# Patient Record
Sex: Female | Born: 1965
Health system: Southern US, Community
[De-identification: ages and names within clinical notes are randomized; demographics above are authoritative.]

## PROBLEM LIST (undated history)

## (undated) DIAGNOSIS — G43909 Migraine, unspecified, not intractable, without status migrainosus: Secondary | ICD-10-CM

## (undated) HISTORY — PX: GALLBLADDER SURGERY: SHX652

## (undated) HISTORY — PX: OTHER SURGICAL HISTORY: SHX169

## (undated) HISTORY — DX: Migraine, unspecified, not intractable, without status migrainosus: G43.909

## (undated) HISTORY — PX: BREAST ENHANCEMENT SURGERY: SHX7

---

## 2017-12-06 LAB — HM HIV SCREENING LAB: HM HIV Screening: NEGATIVE

## 2017-12-06 LAB — HM HEPATITIS C SCREENING LAB: HM Hepatitis Screen: NEGATIVE

## 2019-11-17 DIAGNOSIS — Z1159 Encounter for screening for other viral diseases: Secondary | ICD-10-CM | POA: Diagnosis not present

## 2019-11-17 DIAGNOSIS — R05 Cough: Secondary | ICD-10-CM | POA: Diagnosis not present

## 2019-11-25 DIAGNOSIS — Z20822 Contact with and (suspected) exposure to covid-19: Secondary | ICD-10-CM | POA: Diagnosis not present

## 2019-12-15 DIAGNOSIS — Z780 Asymptomatic menopausal state: Secondary | ICD-10-CM | POA: Diagnosis not present

## 2019-12-15 DIAGNOSIS — B977 Papillomavirus as the cause of diseases classified elsewhere: Secondary | ICD-10-CM | POA: Diagnosis not present

## 2019-12-15 DIAGNOSIS — Z01419 Encounter for gynecological examination (general) (routine) without abnormal findings: Secondary | ICD-10-CM | POA: Diagnosis not present

## 2019-12-15 DIAGNOSIS — Z1151 Encounter for screening for human papillomavirus (HPV): Secondary | ICD-10-CM | POA: Diagnosis not present

## 2019-12-29 DIAGNOSIS — G43709 Chronic migraine without aura, not intractable, without status migrainosus: Secondary | ICD-10-CM | POA: Diagnosis not present

## 2020-01-21 DIAGNOSIS — G43709 Chronic migraine without aura, not intractable, without status migrainosus: Secondary | ICD-10-CM | POA: Diagnosis not present

## 2020-02-06 DIAGNOSIS — N951 Menopausal and female climacteric states: Secondary | ICD-10-CM | POA: Diagnosis not present

## 2020-02-06 DIAGNOSIS — G43909 Migraine, unspecified, not intractable, without status migrainosus: Secondary | ICD-10-CM | POA: Diagnosis not present

## 2020-02-06 DIAGNOSIS — R202 Paresthesia of skin: Secondary | ICD-10-CM | POA: Diagnosis not present

## 2020-02-06 DIAGNOSIS — G4701 Insomnia due to medical condition: Secondary | ICD-10-CM | POA: Diagnosis not present

## 2020-02-06 DIAGNOSIS — Z6379 Other stressful life events affecting family and household: Secondary | ICD-10-CM | POA: Diagnosis not present

## 2020-02-06 DIAGNOSIS — M5382 Other specified dorsopathies, cervical region: Secondary | ICD-10-CM | POA: Diagnosis not present

## 2020-04-16 DIAGNOSIS — R05 Cough: Secondary | ICD-10-CM | POA: Diagnosis not present

## 2020-04-16 DIAGNOSIS — Z1152 Encounter for screening for COVID-19: Secondary | ICD-10-CM | POA: Diagnosis not present

## 2020-06-07 DIAGNOSIS — G43709 Chronic migraine without aura, not intractable, without status migrainosus: Secondary | ICD-10-CM | POA: Diagnosis not present

## 2020-07-15 DIAGNOSIS — R9431 Abnormal electrocardiogram [ECG] [EKG]: Secondary | ICD-10-CM | POA: Diagnosis not present

## 2020-07-15 DIAGNOSIS — Z20822 Contact with and (suspected) exposure to covid-19: Secondary | ICD-10-CM | POA: Diagnosis not present

## 2020-07-15 DIAGNOSIS — R002 Palpitations: Secondary | ICD-10-CM | POA: Diagnosis not present

## 2020-07-15 DIAGNOSIS — G43819 Other migraine, intractable, without status migrainosus: Secondary | ICD-10-CM | POA: Diagnosis not present

## 2020-08-11 DIAGNOSIS — R002 Palpitations: Secondary | ICD-10-CM | POA: Diagnosis not present

## 2020-08-11 DIAGNOSIS — R079 Chest pain, unspecified: Secondary | ICD-10-CM | POA: Diagnosis not present

## 2020-08-11 DIAGNOSIS — R9431 Abnormal electrocardiogram [ECG] [EKG]: Secondary | ICD-10-CM | POA: Diagnosis not present

## 2020-08-18 DIAGNOSIS — G43119 Migraine with aura, intractable, without status migrainosus: Secondary | ICD-10-CM | POA: Diagnosis not present

## 2020-08-18 DIAGNOSIS — G4701 Insomnia due to medical condition: Secondary | ICD-10-CM | POA: Diagnosis not present

## 2020-08-18 DIAGNOSIS — R002 Palpitations: Secondary | ICD-10-CM | POA: Diagnosis not present

## 2020-08-18 DIAGNOSIS — R079 Chest pain, unspecified: Secondary | ICD-10-CM | POA: Diagnosis not present

## 2020-09-27 DIAGNOSIS — Z1231 Encounter for screening mammogram for malignant neoplasm of breast: Secondary | ICD-10-CM | POA: Diagnosis not present

## 2020-09-27 LAB — HM MAMMOGRAPHY

## 2020-10-13 DIAGNOSIS — R9431 Abnormal electrocardiogram [ECG] [EKG]: Secondary | ICD-10-CM | POA: Diagnosis not present

## 2020-11-07 DIAGNOSIS — I493 Ventricular premature depolarization: Secondary | ICD-10-CM | POA: Diagnosis not present

## 2020-11-07 DIAGNOSIS — R9431 Abnormal electrocardiogram [ECG] [EKG]: Secondary | ICD-10-CM | POA: Diagnosis not present

## 2020-11-07 DIAGNOSIS — R002 Palpitations: Secondary | ICD-10-CM | POA: Diagnosis not present

## 2020-11-07 DIAGNOSIS — I491 Atrial premature depolarization: Secondary | ICD-10-CM | POA: Diagnosis not present

## 2020-12-22 DIAGNOSIS — M542 Cervicalgia: Secondary | ICD-10-CM | POA: Diagnosis not present

## 2020-12-22 DIAGNOSIS — G43709 Chronic migraine without aura, not intractable, without status migrainosus: Secondary | ICD-10-CM | POA: Diagnosis not present

## 2020-12-22 DIAGNOSIS — M7918 Myalgia, other site: Secondary | ICD-10-CM | POA: Diagnosis not present

## 2021-01-20 DIAGNOSIS — Z1151 Encounter for screening for human papillomavirus (HPV): Secondary | ICD-10-CM | POA: Diagnosis not present

## 2021-01-20 DIAGNOSIS — Z6821 Body mass index (BMI) 21.0-21.9, adult: Secondary | ICD-10-CM | POA: Diagnosis not present

## 2021-01-20 DIAGNOSIS — B977 Papillomavirus as the cause of diseases classified elsewhere: Secondary | ICD-10-CM | POA: Diagnosis not present

## 2021-01-20 DIAGNOSIS — Z01419 Encounter for gynecological examination (general) (routine) without abnormal findings: Secondary | ICD-10-CM | POA: Diagnosis not present

## 2021-04-06 ENCOUNTER — Encounter: Payer: Self-pay | Admitting: Internal Medicine

## 2021-04-06 ENCOUNTER — Other Ambulatory Visit: Payer: Self-pay

## 2021-04-06 ENCOUNTER — Ambulatory Visit: Payer: BC Managed Care – PPO | Admitting: Internal Medicine

## 2021-04-06 VITALS — BP 104/64 | HR 65 | Temp 97.9°F | Resp 16 | Ht 62.0 in | Wt 124.0 lb

## 2021-04-06 DIAGNOSIS — M5412 Radiculopathy, cervical region: Secondary | ICD-10-CM | POA: Diagnosis not present

## 2021-04-06 DIAGNOSIS — G4452 New daily persistent headache (NDPH): Secondary | ICD-10-CM | POA: Diagnosis not present

## 2021-04-06 DIAGNOSIS — R270 Ataxia, unspecified: Secondary | ICD-10-CM | POA: Diagnosis not present

## 2021-04-06 DIAGNOSIS — G8929 Other chronic pain: Secondary | ICD-10-CM | POA: Insufficient documentation

## 2021-04-06 DIAGNOSIS — D696 Thrombocytopenia, unspecified: Secondary | ICD-10-CM | POA: Insufficient documentation

## 2021-04-06 DIAGNOSIS — M542 Cervicalgia: Secondary | ICD-10-CM | POA: Insufficient documentation

## 2021-04-06 DIAGNOSIS — Z1159 Encounter for screening for other viral diseases: Secondary | ICD-10-CM | POA: Insufficient documentation

## 2021-04-06 DIAGNOSIS — Z0000 Encounter for general adult medical examination without abnormal findings: Secondary | ICD-10-CM | POA: Insufficient documentation

## 2021-04-06 LAB — CBC WITH DIFFERENTIAL/PLATELET
Basophils Absolute: 0 10*3/uL (ref 0.0–0.1)
Basophils Relative: 0.9 % (ref 0.0–3.0)
Eosinophils Absolute: 0.1 10*3/uL (ref 0.0–0.7)
Eosinophils Relative: 2.1 % (ref 0.0–5.0)
HCT: 40.1 % (ref 36.0–46.0)
Hemoglobin: 13.1 g/dL (ref 12.0–15.0)
Lymphocytes Relative: 40.2 % (ref 12.0–46.0)
Lymphs Abs: 1.7 10*3/uL (ref 0.7–4.0)
MCHC: 32.8 g/dL (ref 30.0–36.0)
MCV: 86.9 fl (ref 78.0–100.0)
Monocytes Absolute: 0.5 10*3/uL (ref 0.1–1.0)
Monocytes Relative: 10.6 % (ref 3.0–12.0)
Neutro Abs: 2 10*3/uL (ref 1.4–7.7)
Neutrophils Relative %: 46.2 % (ref 43.0–77.0)
Platelets: 149 10*3/uL — ABNORMAL LOW (ref 150.0–400.0)
RBC: 4.61 Mil/uL (ref 3.87–5.11)
RDW: 13.7 % (ref 11.5–15.5)
WBC: 4.3 10*3/uL (ref 4.0–10.5)

## 2021-04-06 LAB — BASIC METABOLIC PANEL
BUN: 22 mg/dL (ref 6–23)
CO2: 29 mEq/L (ref 19–32)
Calcium: 10.2 mg/dL (ref 8.4–10.5)
Chloride: 103 mEq/L (ref 96–112)
Creatinine, Ser: 0.75 mg/dL (ref 0.40–1.20)
GFR: 88.57 mL/min (ref >60.00)
Glucose, Bld: 87 mg/dL (ref 70–99)
Potassium: 4.7 mEq/L (ref 3.5–5.1)
Sodium: 140 mEq/L (ref 135–145)

## 2021-04-06 LAB — FOLATE: Folate: >24.4 ng/mL (ref >5.9)

## 2021-04-06 LAB — VITAMIN B12: Vitamin B-12: >1550 pg/mL — ABNORMAL HIGH (ref 211–911)

## 2021-04-06 LAB — SEDIMENTATION RATE: Sed Rate: 12 mm/hr (ref 0–30)

## 2021-04-06 NOTE — Patient Instructions (Signed)
General Headache Without Cause A headache is pain or discomfort felt around the head or neck area. The specific cause of a headache may not be found. There are many causes and types of headaches. A few common ones are: Tension headaches. Migraine headaches. Cluster headaches. Chronic daily headaches. Follow these instructions at home: Watch your condition for any changes. Let your health care provider know aboutthem. Take these steps to help with your condition: Managing pain     Take over-the-counter and prescription medicines only as told by your health care provider. Lie down in a dark, quiet room when you have a headache. If directed, put ice on your head and neck area: Put ice in a plastic bag. Place a towel between your skin and the bag. Leave the ice on for 20 minutes, 2-3 times per day. If directed, apply heat to the affected area. Use the heat source that your health care provider recommends, such as a moist heat pack or a heating pad. Place a towel between your skin and the heat source. Leave the heat on for 20-30 minutes. Remove the heat if your skin turns bright red. This is especially important if you are unable to feel pain, heat, or cold. You may have a greater risk of getting burned. Keep lights dim if bright lights bother you or make your headaches worse. Eating and drinking Eat meals on a regular schedule. If you drink alcohol: Limit how much you use to: 0-1 drink a day for women. 0-2 drinks a day for men. Be aware of how much alcohol is in your drink. In the [place] one drink equals one 12 oz bottle of beer (355 mL), one 5 oz glass of wine (148 mL), or one 1 oz glass of hard liquor (44 mL). Stop drinking caffeine, or decrease the amount of caffeine you drink. General instructions  Keep a headache journal to help find out what may trigger your headaches. For example, write down: What you eat and drink. How much sleep you get. Any change to your diet or  medicines. Try massage or other relaxation techniques. Limit stress. Sit up straight, and do not tense your muscles. Do not use any products that contain nicotine or tobacco, such as cigarettes, e-cigarettes, and chewing tobacco. If you need help quitting, ask your health care provider. Exercise regularly as told by your health care provider. Sleep on a regular schedule. Get 7-9 hours of sleep each night, or the amount recommended by your health care provider. Keep all follow-up visits as told by your health care provider. This is important.  Contact a health care provider if: Your symptoms are not helped by medicine. You have a headache that is different from the usual headache. You have nausea or you vomit. You have a fever. Get help right away if: Your headache becomes severe quickly. Your headache gets worse after moderate to intense physical activity. You have repeated vomiting. You have a stiff neck. You have a loss of vision. You have problems with speech. You have pain in the eye or ear. You have muscular weakness or loss of muscle control. You lose your balance or have trouble walking. You feel faint or pass out. You have confusion. You have a seizure. Summary A headache is pain or discomfort felt around the head or neck area. There are many causes and types of headaches. In some cases, the cause may not be found. Keep a headache journal to help find out what may trigger your headaches. Watch   your condition for any changes. Let your health care provider know about them. Contact a health care provider if you have a headache that is different from the usual headache, or if your symptoms are not helped by medicine. Get help right away if your headache becomes severe, you vomit, you have a loss of vision, you lose your balance, or you have a seizure. This information is not intended to replace advice given to you by your health care provider. Make sure you discuss any questions  you have with your healthcare provider. Document Revised: 03/11/2018 Document Reviewed: 03/11/2018 Elsevier Patient Education  2022 [employer]  

## 2021-04-06 NOTE — Progress Notes (Signed)
Subjective:  Patient ID: Amber Richmond, female    DOB: [birth year]  Age: 55 y.o. MRN: [id number]  CC: Headache  This visit occurred during the SARS-CoV-2 public health emergency.  Safety protocols were in place, including screening questions prior to the visit, additional usage of staff PPE, and extensive cleaning of exam room while observing appropriate contact time as indicated for disinfecting solutions.    HPI Amber Richmond presents for f/up and to establish.  Amber Richmond has a history of chronic migraines but for the last 3 weeks Amber Richmond has felt different.  Amber Richmond reports new onset ataxia with a sensation that Amber Richmond is bumping into walls.  Amber Richmond complains of dizzy spells and lightheadedness.  Amber Richmond has had a new onset bilateral occipital headache and neck pain that Amber Richmond describes as a sharp sensation.  The neck pain radiates into both upper extremities.  Amber Richmond wakes up in the morning with a headache.  Amber Richmond has had some weird sensations in her right upper extremity but denies recurring episodes of paresthesias.  Amber Richmond has been trying to control the pain with Excedrin, Motrin, Aleve, and Zomig.  Amber Richmond walks about 3 miles a day and does not experience CP, DOE, palpitations, edema, or fatigue.  Amber Richmond has a history of an elevated heart rate and is had a thorough work-up earlier this year with a 2-week heart monitor and an echocardiogram.  History Amber Richmond has a past medical history of Migraines.   Amber Richmond has a past surgical history that includes Gallbladder surgery; Breast enhancement surgery; and tummy tuck.   Her family history includes COPD in her mother; Cancer in her father; Cataracts in her father; Diabetes in her father, maternal grandmother, and mother; Heart disease in her father; Hypertension in her father.Amber Richmond reports that Amber Richmond has never smoked. Amber Richmond has never used smokeless tobacco. Amber Richmond reports that Amber Richmond does not drink alcohol and does not use drugs.  Outpatient Medications Prior to Visit  Medication Sig Dispense  Refill   prochlorperazine (COMPAZINE) 25 MG suppository Place 25 mg rectally every 12 (twelve) hours as needed for nausea or vomiting.     SUMAtriptan (IMITREX) 100 MG tablet Take 100 mg by mouth every 2 (two) hours as needed for migraine. May repeat in 2 hours if headache persists or recurs.     zolmitriptan (ZOMIG) 5 MG tablet Take 5 mg by mouth as needed for migraine.     No facility-administered medications prior to visit.    ROS Review of Systems  Constitutional:  Negative for diaphoresis and fatigue.  HENT: Negative.  Negative for trouble swallowing.   Respiratory:  Negative for cough, chest tightness, shortness of breath and wheezing.   Cardiovascular:  Negative for chest pain, palpitations and leg swelling.  Gastrointestinal:  Negative for abdominal pain, diarrhea and nausea.  Genitourinary:  Negative for difficulty urinating and dysuria.  Musculoskeletal:  Positive for gait problem and neck pain. Negative for arthralgias, back pain, joint swelling and myalgias.  Skin: Negative.  Negative for rash.  Neurological:  Positive for dizziness, light-headedness and headaches. Negative for facial asymmetry, weakness and numbness.  Hematological:  Negative for adenopathy. Does not bruise/bleed easily.  Psychiatric/Behavioral: Negative.     Objective:  BP 104/64 (BP Location: Left Arm, Patient Position: Sitting, Cuff Size: Large)   Pulse 65   Temp 97.9 F (36.6 C) (Oral)   Resp 16   Ht 5\' 2"  (1.575 m)   Wt 124 lb (56.2 kg)   SpO2 99%   BMI 22.68 kg/m  Physical Exam Vitals reviewed.  Constitutional:      Appearance: Amber Richmond is well-developed.  HENT:     Head: Normocephalic and atraumatic.  Eyes:     General: No scleral icterus.    Extraocular Movements: Extraocular movements intact.     Pupils: Pupils are equal, round, and reactive to light. Pupils are equal.  Cardiovascular:     Rate and Rhythm: Normal rate and regular rhythm.     Heart sounds: No murmur heard. Pulmonary:      Effort: Pulmonary effort is normal.     Breath sounds: No stridor. No wheezing, rhonchi or rales.  Abdominal:     General: Abdomen is flat.     Palpations: There is no mass.     Tenderness: There is no abdominal tenderness. There is no guarding.     Hernia: No hernia is present.  Musculoskeletal:        General: Normal range of motion.     Cervical back: Normal range of motion and neck supple.     Right lower leg: No edema.     Left lower leg: No edema.     Comments: There is a benign appearing ecchymosis over the left anterior proximal thigh.  Skin:    General: Skin is warm and dry.     Coloration: Skin is not pale.     Findings: Bruising present.  Neurological:     Mental Status: Amber Richmond is alert.     Cranial Nerves: Cranial nerves are intact. No cranial nerve deficit or facial asymmetry.     Sensory: Sensation is intact. No sensory deficit.     Motor: No weakness.     Coordination: Romberg sign positive.     Gait: Gait is intact. Gait normal.     Deep Tendon Reflexes: Reflexes normal.     Reflex Scores:      Tricep reflexes are 0 on the right side and 0 on the left side.      Bicep reflexes are 0 on the right side and 0 on the left side.      Brachioradialis reflexes are 0 on the right side and 0 on the left side.      Patellar reflexes are 1+ on the right side and 1+ on the left side.      Achilles reflexes are 0 on the right side and 0 on the left side. Psychiatric:        Mood and Affect: Mood normal.        Behavior: Behavior normal.    Lab Results  Component Value Date   WBC 4.3 04/06/2021   HGB 13.1 04/06/2021   HCT 40.1 04/06/2021   PLT 149.0 (L) 04/06/2021   GLUCOSE 87 04/06/2021   NA 140 04/06/2021   K 4.7 04/06/2021   CL 103 04/06/2021   CREATININE 0.75 04/06/2021   BUN 22 04/06/2021   CO2 29 04/06/2021     Assessment & Plan:   Veleka was seen today for headache.  Diagnoses and all orders for this visit:  Need for hepatitis C screening test -      Cancel: Hepatitis C antibody; Future  Routine general medical examination at a health care facility -     Cancel: Hepatitis C antibody; Future -     Cancel: HIV Antibody (routine testing w rflx); Future  Ataxia- See below. -     CBC with Differential/Platelet; Future -     Basic metabolic panel; Future -  Vitamin B12; Future -     Folate; Future -     Sedimentation rate; Future -     MR Brain Wo Contrast; Future -     MR Angiogram Head Wo Contrast; Future -     Sedimentation rate -     Folate -     Vitamin B12 -     Basic metabolic panel -     CBC with Differential/Platelet  Cervical radiculitis- I recommended an MRI of the cervical spine to see if there is spinal stenosis, nerve impingement, degenerative disc disease, or spinal tumor. -     Sedimentation rate; Future -     MR Cervical Spine Wo Contrast; Future -     Sedimentation rate  New daily persistent headache- Amber Richmond has a new onset headache after the age of 14.  I recommended Amber Richmond undergo an MRI/MRA to screen for mass, bleed, NPH, and aneurysm. -     CBC with Differential/Platelet; Future -     Basic metabolic panel; Future -     Sedimentation rate; Future -     MR Brain Wo Contrast; Future -     MR Angiogram Head Wo Contrast; Future -     Sedimentation rate -     Basic metabolic panel -     CBC with Differential/Platelet  Cervicalgia -     MR Cervical Spine Wo Contrast; Future  Thrombocytopenia (HCC)- I think this explains the bruising.  I recommended that Amber Richmond avoid NSAIDs.  I am having Loman [place]. Pafford maintain her zolmitriptan, SUMAtriptan, and prochlorperazine.  No orders of the defined types were placed in this encounter.    Follow-up: Return in about 6 weeks (around 05/18/2021).  Sanda Linger, MD

## 2021-04-07 ENCOUNTER — Encounter: Payer: Self-pay | Admitting: Internal Medicine

## 2021-04-18 ENCOUNTER — Telehealth: Payer: Self-pay

## 2021-04-18 NOTE — Telephone Encounter (Signed)
Patient wanting to know if Dr. Yetta Barre would clear the patient to have breast implants removed.  She states she also has and updated EKG that she can have sent over to this office.  Please contact pt at [phone]

## 2021-04-19 ENCOUNTER — Encounter: Payer: Self-pay | Admitting: Internal Medicine

## 2021-04-19 NOTE — Telephone Encounter (Signed)
Called pt, LVM stating letter was ready.   Need clarification if pt would like to pick up the letter or if she would like it faxed somewhere.

## 2021-04-20 NOTE — Telephone Encounter (Signed)
Called pt, LVM.    Letter has been mailed to pt.

## 2021-05-15 ENCOUNTER — Ambulatory Visit
Admission: RE | Admit: 2021-05-15 | Discharge: 2021-05-15 | Disposition: A | Discharge status: Home / Self Care | Payer: BC Managed Care – PPO | Source: Ambulatory Visit | Attending: Internal Medicine | Admitting: Internal Medicine

## 2021-05-15 ENCOUNTER — Other Ambulatory Visit: Payer: Self-pay

## 2021-05-15 DIAGNOSIS — M542 Cervicalgia: Secondary | ICD-10-CM

## 2021-05-15 DIAGNOSIS — R270 Ataxia, unspecified: Secondary | ICD-10-CM

## 2021-05-15 DIAGNOSIS — M5412 Radiculopathy, cervical region: Secondary | ICD-10-CM

## 2021-05-15 DIAGNOSIS — G4452 New daily persistent headache (NDPH): Secondary | ICD-10-CM

## 2021-05-15 DIAGNOSIS — R519 Headache, unspecified: Secondary | ICD-10-CM | POA: Diagnosis not present

## 2021-05-15 DIAGNOSIS — M4802 Spinal stenosis, cervical region: Secondary | ICD-10-CM | POA: Diagnosis not present

## 2021-05-16 ENCOUNTER — Other Ambulatory Visit: Payer: Self-pay | Admitting: Internal Medicine

## 2021-05-16 DIAGNOSIS — M4802 Spinal stenosis, cervical region: Secondary | ICD-10-CM | POA: Insufficient documentation

## 2021-05-18 ENCOUNTER — Telehealth: Payer: Self-pay | Admitting: Internal Medicine

## 2021-05-18 NOTE — Telephone Encounter (Signed)
   Patient requesting call today to discuss MRI results.

## 2021-05-20 DIAGNOSIS — R519 Headache, unspecified: Secondary | ICD-10-CM | POA: Diagnosis not present

## 2021-05-20 DIAGNOSIS — I729 Aneurysm of unspecified site: Secondary | ICD-10-CM | POA: Diagnosis not present

## 2021-08-10 ENCOUNTER — Ambulatory Visit: Payer: BC Managed Care – PPO | Admitting: Neurology

## 2021-08-10 ENCOUNTER — Encounter: Payer: Self-pay | Admitting: Neurology

## 2021-08-10 ENCOUNTER — Other Ambulatory Visit: Payer: Self-pay

## 2021-08-10 VITALS — BP 133/82 | HR 58 | Ht 62.0 in | Wt 126.4 lb

## 2021-08-10 DIAGNOSIS — G43709 Chronic migraine without aura, not intractable, without status migrainosus: Secondary | ICD-10-CM

## 2021-08-10 DIAGNOSIS — M542 Cervicalgia: Secondary | ICD-10-CM | POA: Diagnosis not present

## 2021-08-10 DIAGNOSIS — G8929 Other chronic pain: Secondary | ICD-10-CM

## 2021-08-10 DIAGNOSIS — M4802 Spinal stenosis, cervical region: Secondary | ICD-10-CM | POA: Diagnosis not present

## 2021-08-10 DIAGNOSIS — G43009 Migraine without aura, not intractable, without status migrainosus: Secondary | ICD-10-CM

## 2021-08-10 MED ORDER — QULIPTA 60 MG PO TABS: 60.0000 mg | ORAL_TABLET | Freq: Every day | ORAL | 6 refills | Status: DC

## 2021-08-10 MED ORDER — PROCHLORPERAZINE MALEATE 10 MG PO TABS: 10.0000 mg | ORAL_TABLET | Freq: Two times a day (BID) | ORAL | 11 refills | Status: AC | PRN

## 2021-08-10 MED ORDER — ZOLMITRIPTAN 5 MG PO TABS: 5.0000 mg | ORAL_TABLET | ORAL | 11 refills | Status: DC | PRN

## 2021-08-10 MED ORDER — NURTEC 75 MG PO TBDP: 75.0000 mg | ORAL_TABLET | Freq: Every day | ORAL | 6 refills | Status: DC | PRN

## 2021-08-10 NOTE — Progress Notes (Addendum)
[facility]    Provider:  Dr Lucia Gaskins Requesting Provider: [name], MD Primary Care Provider:  [name], MD  CC:  headaches and neck pain  Addendum 09/12/2021: She has between 4 and 14 migraine+headache days total months for the last 6 months. Migraines are  pulsating/ pounding/ throbbing, unilateral, nausea, phono/phonophobia, no aura, no medication overuse. Can qualify for episodic migraines and qulipta as she is doing very well on it. Also try Ubrelvy.  HPI:  Amber Richmond is a 55 y.o. female here as requested by [name], MD for headaches. PMHx migraines, cervical spinal stenosis. Started as menstrual migraines after the birth of her daughter, she dealt with them, they fluctuated, she has seen multiple neurologists, she had to go to the hospital, she saw a prominent neurologist in [place]. They did not go away after menopause. She is post menopausal now for 3 years. She has a history waking with the migraines in the morning, more consistently in the morning than anything. She has daily neck pain. In the past  she always had them on the left side, pulsating/pounding/throbbing, photo/phonophobia, nausea, has vomited in the past, movement would make it worse. When she wakes up she has severe neck pain. It hursts to put her neck pain back. She can feel the pain in her neck and thet is where the pain is coming from radiating up the back of the head. (Points to the emergence of the occipital nerves). She has numbness in her fingers. She had botox. She had cervical muscle pain and a lot of joint issues on the aimovig. Both daughters on migraines. No other focal neurologic deficits, associated symptoms, inciting events or modifiable factors.  She was on multiple preventatives: from a thorough review of records: aimovig (helped but had side effects), Emgality, botox, zomig, imitrex, compazine, qulipta, emgality, topamax, elavil, pamelor, depakote, inderal,  excedrin with no benefit.  She tried Effexor did not like how it made her feel.  Naprosyn, Aleve, ibuprofen, Excedrin did not help, Migranal did not help.  Imitrex may help the best for severe migraines but Zomig works better for moderate migraines.  Uses Phenergan for nausea.  Reviewed notes, labs and imaging from outside physicians, which showed:  I reviewed notes from Dr. Johnsie Cancel, this is a 55 year old female with a history of migraines and neck pain, progressive over multiple years, improved with morning stretches of her neck but worsened throughout the day with normal activities, she has been taking daily NSAIDs, headaches are occipital base, fairly well time to the neck pain, no radicular pain at this time, no symptoms consistent with myelopathy and review of symptoms but does have some vertigo.    I also reviewed notes from neurology Dr. Baldo Daub she was seen there with chronic migraine without aura in April 2022, tried Zomig, Aimovig worked well for the first month then she had side effects, now she is back up to frequent migraines being off the Aimovig, 20 migraine days per month, the patient has had 15 or more headache days per month of which 8 or more migraines lasting for longer than 4 hours if untreated for years.  Emgality did not seem to help.  She has eliminated sugars from her diet, she tried gluten-free, magnesium supplements, they are pulsating and pounding bitemporal sometimes in the occipital regions associated with photophobia and phonophobia nausea without vomiting.  Per his notes, the patient had a normal MRI of the head with and without contrast in May 2021.  She also reported neck pain which may be triggering her migraines.  No radicular symptoms.  They discussed Ajovy.  Other meds discussed atogepant and she was educated on medication overuse headaches.  They also discussed myofascial pain syndrome, PT, trigger point injections.  MRI cervical spine: 05/16/2021 IMPRESSION: 1.  Multilevel cervical spondylosis with resultant moderate diffuse spinal stenosis at C3-4 through C6-7. 2. Multifactorial degenerative changes with resultant multilevel foraminal narrowing as above. Notable findings include moderate left C4 foraminal stenosis, moderate bilateral C5 foraminal narrowing, moderate right worse than left C6 foraminal stenosis, with severe right and moderate left C7 foraminal narrowing.  MRA head:  1. No intracranial large vessel occlusion or proximal high-grade arterial stenosis. 2. 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  05/15/2021: MRI brain/MRA head: IMPRESSION: MRI brain:   Unremarkable non-contrast MRI appearance of the brain. No evidence of acute intracranial abnormality.   MRA head:   1. No intracranial large vessel occlusion or proximal high-grade arterial stenosis. 2. 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  Personally reviewed images and agree, also reviewed images with patient today  Review of Systems: Patient complains of symptoms per HPI as well as the following symptoms neck pain. Pertinent negatives and positives per HPI. All others negative.   Social History   Socioeconomic History   Marital status: Unknown    Spouse name: Not on file   Number of children: Not on file   Years of education: Not on file   Highest education level: Not on file  Occupational History   Not on file  Tobacco Use   Smoking status: Never   Smokeless tobacco: Never  Vaping Use   Vaping Use: Never used  Substance and Sexual Activity   Alcohol use: Never   Drug use: Never   Sexual activity: Not Currently  Other Topics Concern   Not on file  Social History Narrative   Caffeine- decaff one cup daily.  Education HS,.  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   Social Determinants of Health   Financial Resource Strain: Not on file  Food Insecurity: Not on file  Transportation Needs: Not on file  Physical Activity: Not on file  Stress: Not on file  Social Connections: Not on file  Intimate Partner Violence: Not on file    Family History  Problem Relation Age of Onset   Diabetes Mother    COPD Mother    Hypertension Father    Cancer Father        skin, leukemia   Heart disease Father    Diabetes Father    Cataracts Father    Diabetes Maternal Grandmother    Migraines Neg Hx     Past Medical History:  Diagnosis Date   Migraines     Patient Active Problem List   Diagnosis Date Noted   Cervical spinal stenosis 05/16/2021   Routine general medical examination at a health care facility 04/06/2021   New daily persistent headache 04/06/2021   Thrombocytopenia (HCC) 04/06/2021    Past Surgical History:  Procedure Laterality Date   BREAST ENHANCEMENT SURGERY     GALLBLADDER SURGERY     tummy tuck      Current Outpatient Medications  Medication Sig Dispense Refill   Atogepant (QULIPTA) 60 MG TABS Take 60 mg by mouth daily. 30 tablet 6   Rimegepant Sulfate (NURTEC) 75  MG TBDP Take 75 mg by mouth daily as needed. For migraines. Take as close to onset of migraine as possible. One daily maximum. 10 tablet 6   SUMAtriptan (IMITREX) 100 MG tablet Take 100 mg by mouth every 2 (two) hours as needed for migraine. May repeat in 2 hours if headache persists or recurs.     prochlorperazine (COMPAZINE) 10 MG tablet Take 1 tablet (10 mg total) by mouth every 12 (twelve) hours as needed for nausea or vomiting. 30 tablet 11   zolmitriptan (ZOMIG) 5 MG tablet Take 1 tablet (5 mg total) by mouth as needed for migraine. 10 tablet 11   No current facility-administered medications for this visit.    Allergies as of 08/10/2021   (No Known Allergies)    Vitals: BP 133/82   Pulse (!) 58   Ht 5\' 2"  (1.575 m)   Wt 126 lb 6.4  oz (57.3 kg)   BMI 23.12 kg/m  Last Weight:  Wt Readings from Last 1 Encounters:  08/10/21 126 lb 6.4 oz (57.3 kg)   Last Height:   Ht Readings from Last 1 Encounters:  08/10/21 5\' 2"  (1.575 m)     Physical exam: Exam: Gen: NAD, conversant, well nourised,  well groomed                     CV: RRR, no MRG. No Carotid Bruits. No peripheral edema, warm, nontender Eyes: Conjunctivae clear without exudates or hemorrhage  Neuro: Detailed Neurologic Exam  Speech:    Speech is normal; fluent and spontaneous with normal comprehension.  Cognition:    The patient is oriented to person, place, and time;     recent and remote memory intact;     language fluent;     normal attention, concentration,     fund of knowledge Cranial Nerves:    The pupils are equal, round, and reactive to light. The fundi are normal and spontaneous venous pulsations are present. Visual fields are full to finger confrontation. Extraocular movements are intact. Trigeminal sensation is intact and the muscles of mastication are normal. The face is symmetric. The palate elevates in the midline. Hearing intact. Voice is normal. Shoulder shrug is normal. The tongue has normal motion without fasciculations.   Coordination:    Normal finger to nose and heel to shin. Normal rapid alternating movements.   Gait:  normal.   Motor Observation:    No asymmetry, no atrophy, and no involuntary movements noted. Tone:    Normal muscle tone.    Posture:    Posture is normal. normal erect    Strength: symmetric strength, mild prox LE weakness mild otherwise Strength is V/V in the upper and lower limbs.      Sensation: intact to LT     Reflex Exam:  DTR's:    Deep tendon reflexes in the upper and lower extremities are brisk bilaterally.   Toesequiv    The toes are downgoing bilaterally.   Clonus:    2 beats clonus ajs   Hoffman's negative  Assessment/Plan:    Migraines : She has seen prior neurologists  about  her migraines and failed multiple medications, even CGRP injectables and Botox; She was on multiple preventatives: from a thorough review of records: aimovig (helped but had side effects), Emgality, botox, zomig, imitrex, compazine, qulipta, emgality, topamax, elavil, pamelor, depakote, inderal, excedrin with no benefit.  She tried Effexor did not like how it made her feel.  Naprosyn, Aleve, ibuprofen, Excedrin did  not help, Migranal did not help.  Imitrex may help the best for severe migraines but Zomig works better for moderate migraines.  Uses Phenergan or compazine for nausea.   Prevention: Qulipta daily for prevention of migraines. Acute:Try nurtec once daily as needed for acute May take nurtec with triptans. Refill zomig and compazine. Addendum 09/12/2021: She has between 4 and 14 migraine+headache days total months for the last 6 months. Migraines are  pulsating/ pounding/ throbbing, unilateral, nausea, phono/phonophobia, no aura, no medication overuse. Can qualify for episodic migraines and qulipta as she is doing very well on it. Also try Ubrelvy.  - Neck pain:  We had a long talk, she has multilevel cervical spondylosis with resultant moderate diffuse spinal stenosis at C3-4 through C6-7 (worst at c5-c6 and c3-c4 measuring approx 7mm) with multi-level foraminal stenosis.  She does not have radicular symptoms however she does have midline cervical pain, daily neck pain, neck pain is a trigger, and neck pain radiates from the midline approximately C5-C6 up the neck and from the emergence of the occipital nerves and radiating up the back of the head.  Feels neck pain is a migraine trigger. We spoke that surgery may be extensive in her cervical spine, and being only 55 she could go either way decide to have the surgery if needed or try and postpone with cervical injections, PT, dry needling. However I think her neck pain is triggering many of her migraines and she has tried multiple first-line and  second-line migraine meds with no relief;  She does have brisk reflexes otherwise no myelopathic signs:  Send to PT including dry needling   Cervical injections: She does not have radicular symptoms but she has tremendous midline neck pain(and cervical myofascial neck pain) maybe something can be done interventionally? maybe medial branch blocks for occipital neuralgia or possibly facet injections? Her neck is terrible for her age; she has multilevel cervical spondylosis with resultant moderate diffuse spinal stenosis at C3-4 through C6-7 (worst at c5-c6 and c3-c4 measuring approx 7mm) with multi-level foraminal stenosis. Sent a mychart message to see if Dr. Maurice Small thinks Dr. Geanie Cooley can try anything.   PT: Physical Therapy: Cervical myofascial pain, cervical stenosis with chronic neck pain,  contributing to migraines and cervicalgia. Please evaluate and treat including dry needling, stretching, strengthening, manual therapy/massage, heating, TENS unit, exercising for scapular stabilization, pectoral stretching and rhomboid strengthening as clinically warranted as well as any other modality as recommended by evaluation.   Orders Placed This Encounter  Procedures   Ambulatory referral to Physical Therapy    Meds ordered this encounter  Medications   zolmitriptan (ZOMIG) 5 MG tablet    Sig: Take 1 tablet (5 mg total) by mouth as needed for migraine.    Dispense:  10 tablet    Refill:  11   prochlorperazine (COMPAZINE) 10 MG tablet    Sig: Take 1 tablet (10 mg total) by mouth every 12 (twelve) hours as needed for nausea or vomiting.    Dispense:  30 tablet    Refill:  11   Atogepant (QULIPTA) 60 MG TABS    Sig: Take 60 mg by mouth daily.    Dispense:  30 tablet    Refill:  6   Rimegepant Sulfate (NURTEC) 75 MG TBDP    Sig: Take 75 mg by mouth daily as needed. For migraines. Take as close to onset of migraine as possible. One daily maximum.    Dispense:  10 tablet    Refill:  6    Cc:  [name], MD,  [name], MD  Naomie Dean, MD  [facility] [address] [place], [place] [address]  Phone [phone] Fax [phone]  I spent 110 minutes of face-to-face and non-face-to-face time with patient on the  1. Chronic migraine w/o aura w/o status migrainosus, not intractable   2. Chronic neck pain   3. Myofascial neck pain   4. Cervical stenosis of spinal canal    diagnosis.  This included previsit chart review, lab review, study review, order entry, electronic health record documentation, patient education on the different diagnostic and therapeutic options, counseling and coordination of care, risks and benefits of management, compliance, or risk factor reduction

## 2021-08-10 NOTE — Patient Instructions (Addendum)
Send referral for injections Send to PT with dry needling Send to [facility] notes and text  Qulipta daily for prevention Try nurtec once daily as needed for acute May take nurtec with triptans  Atogepant tablets What is this medication? ATOGEPANT (a TOE je pant) is used to prevent migraine headaches. This medicine may be used for other purposes; ask your health care provider or pharmacist if you have questions. COMMON BRAND NAME(S): QULIPTA What should I tell my care team before I take this medication? They need to know if you have any of these conditions: kidney disease liver disease an unusual or allergic reaction to atogepant, other medicines, foods, dyes, or preservatives pregnant or trying to get pregnant breast-feeding How should I use this medication? Take this medicine by mouth with water. Take it as directed on the prescription label at the same time every day. You can take it with or without food. If it upsets your stomach, take it with food. Keep taking it unless your health care provider tells you to stop. Talk to your health care provider about the use of this medicine in children. Special care may be needed. Overdosage: If you think you have taken too much of this medicine contact a poison control center or emergency room at once. NOTE: This medicine is only for you. Do not share this medicine with others. What if I miss a dose? If you miss a dose, take it as soon as you can. If it is almost time for your next dose, take only that dose. Do not take double or extra doses. What may interact with this medication? carbamazepine certain medicines for fungal infections like itraconazole, ketoconazole clarithromycin cyclosporine efavirenz etravirine phenytoin rifampin [facility]. John's Wort This list may not describe all possible interactions. Give your health care provider a list of all the medicines, herbs, non-prescription drugs, or dietary supplements you use. Also tell them  if you smoke, drink alcohol, or use illegal drugs. Some items may interact with your medicine. What should I watch for while using this medication? Visit your health care provider for regular checks on your progress. Tell your health care provider if your symptoms do not start to get better or if they get worse. What side effects may I notice from receiving this medication? Side effects that you should report to your doctor or health care provider as soon as possible: allergic reactions (skin rash, itching or hives; swelling of the face, lips, tongue) light-colored stool liver injury (dark yellow or brown urine; general ill feeling or flu-like symptoms; loss of appetite, right upper belly pain; unusually weak or tired, yellowing of the eyes or skin) Side effects that usually do not require medical attention (report these to your doctor or health care provider if they continue or are bothersome): constipation lack or loss of appetite nausea unusually weak or tired weight loss This list may not describe all possible side effects. Call your doctor for medical advice about side effects. You may report side effects to FDA at 1-800-FDA-1088. Where should I keep my medication? Keep out of the reach of children and pets. Store at room temperature between 20 and 25 degrees C (68 and 77 degrees F). Get rid of any unused medicine after the expiration date. To get rid of medicines that are no longer needed or have expired: Take the medicine to a medicine take-back program. Check with your pharmacy or law enforcement to find a location. If you cannot return the medicine, check the label or package  insert to see if the medicine should be thrown out in the garbage or flushed down the toilet. If you are not sure, ask your health care provider. If it is safe to put it in the trash, take the medicine out of the container. Mix the medicine with cat litter, dirt, coffee grounds, or other unwanted substance. Seal the  mixture in a bag or container. Put it in the trash. NOTE: This sheet is a summary. It may not cover all possible information. If you have questions about this medicine, talk to your doctor, pharmacist, or health care provider.  2022 Elsevier/Gold Standard (2020-06-07 00:00:00)

## 2021-08-16 ENCOUNTER — Telehealth: Payer: Self-pay | Admitting: *Deleted

## 2021-08-16 NOTE — Telephone Encounter (Signed)
-----   Message from Anson Fret, MD sent at 08/16/2021 12:44 PM EST ----- Regarding: FW: patient you referred Please call patient and let her know that I communicated with Dr. Mittie Bodo we discussed) about her neck pain. He would like to see her back in the office to discuss. Can she please call and make a follow up appointment with Dr. Maurice Small please about her neck? Thanks   ----- Message ----- From: [name], MD Sent: 08/15/2021  10:04 PM EST To: Anson Fret, MD Subject: RE: patient you referred                       Yeah I can take a look, have her come back and see me in clinic and I'll come up with a game plan for the neck.  ----- Message ----- From: Anson Fret, MD Sent: 08/15/2021   7:20 PM EST To: [name], MD Subject: patient you referred                           Hey Dr. Val EagleBonita Quin sent me this patient for migraines. She also has terrible neck pain that is contributing. As you know, her neck looks terrible for her age (well, I think it does; She has multilevel cervical spondylosis with resultant moderate diffuse spinal stenosis at C3-4 through C6-7 worst at c5-c6 and c3-c4 measuring approx 31mm with multi-level foraminal stenosis. Only 55 years old). She does not have radicular symptoms but she has tremendous midline neck pain, occipital neuralgia and cervical myofascial neck pain contributing to her headaches for which she has been to multiple neurologists about and failed tons of meds even botox and the new CGRP meds. Can something be done for her neck interventionally? maybe medial branch blocks for her occipital neuralgia or possibly facet injections? Let me know what you think. She has pretty brisk reflexes but otherwise not myelopathic without radiculopathy so I guess surgery isn't the best option? But her neck is surely contributing as a trigger to her migraines, can Dr. Geanie Cooley help you think?  Amber Richmond

## 2021-08-16 NOTE — Telephone Encounter (Signed)
I called pt and relayed Dr. Trevor Mace note about calling Dr. Maurice Small for follow, she had his #, and also I gave her the # to [phone] [facility] rehab for evaluate and treat (PT).  She appreciated call back.

## 2021-09-07 ENCOUNTER — Other Ambulatory Visit: Payer: Self-pay

## 2021-09-07 ENCOUNTER — Ambulatory Visit: Payer: BC Managed Care – PPO | Attending: Neurology

## 2021-09-07 DIAGNOSIS — R519 Headache, unspecified: Secondary | ICD-10-CM | POA: Diagnosis not present

## 2021-09-07 DIAGNOSIS — M542 Cervicalgia: Secondary | ICD-10-CM | POA: Diagnosis not present

## 2021-09-07 DIAGNOSIS — G8929 Other chronic pain: Secondary | ICD-10-CM | POA: Diagnosis not present

## 2021-09-07 DIAGNOSIS — M4802 Spinal stenosis, cervical region: Secondary | ICD-10-CM | POA: Insufficient documentation

## 2021-09-07 DIAGNOSIS — G43009 Migraine without aura, not intractable, without status migrainosus: Secondary | ICD-10-CM | POA: Insufficient documentation

## 2021-09-07 NOTE — Therapy (Signed)
OUTPATIENT PHYSICAL THERAPY CERVICAL EVALUATION   Patient Name: Amber Richmond MRN: [id number] DOB:Feb 01, 1966, 56 y.o., female Today's Date: 09/07/2021   PT End of Session - 09/07/21 1930     Visit Number 1    Number of Visits 17    Date for PT Re-Evaluation 11/02/21    PT Start Time 1315    PT Stop Time 1400    PT Time Calculation (min) 45 min    Activity Tolerance Patient tolerated treatment well    Behavior [facility] for tasks assessed/performed             Past Medical History:  Diagnosis Date   Migraines    Past Surgical History:  Procedure Laterality Date   BREAST ENHANCEMENT SURGERY     GALLBLADDER SURGERY     tummy tuck     Patient Active Problem List   Diagnosis Date Noted   Cervical spinal stenosis 05/16/2021   Routine general medical examination at a health care facility 04/06/2021   New daily persistent headache 04/06/2021   Thrombocytopenia (HCC) 04/06/2021    PCP: Etta GrandchildJones, Thomas L, MD  REFERRING PROVIDER: Anson FretAhern, Antonia B, MD  REFERRING DIAG: [phone] (ICD-10-CM) - Chronic migraine w/o aura w/o status migrainosus, not intractable M54.2,G89.29 (ICD-10-CM) - Chronic neck pain M54.2 (ICD-10-CM) - Myofascial neck pain M48.02 (ICD-10-CM) - Cervical stenosis of spinal canal   THERAPY DIAG:  Neck pain - Plan: PT plan of care cert/re-cert  Chronic intractable headache, unspecified headache type - Plan: PT plan of care cert/re-cert  ONSET DATE: 08/15/21  SUBJECTIVE:                                                                                                                                                                                                         SUBJECTIVE STATEMENT: Pt has migraines for 30 years but recently has been having neck pain for last 6 months. She started taking Qulipta for 1 month which has been helping with her migraine.   PERTINENT HISTORY:  PMHx migraines, cervical spinal stenosis.   PAIN:  Are you having  pain? Yes VAS scale: 6-8/10 Pain location: Neck, shoulders, head Pain orientation: Bilateral  PAIN TYPE: aching and tight Pain description: constant and dull  Aggravating factors: turning head Relieving factors: Medication  PRECAUTIONS: None  WEIGHT BEARING RESTRICTIONS No  FALLS:  Has patient fallen in last 6 months? 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 Multilevel cervical spondylosis with resultant moderate diffuse spinal stenosis at C3-4 through C6-7. 2. Multifactorial degenerative changes with resultant multilevel foraminal narrowing as above. Notable findings include moderate left C4 foraminal stenosis, moderate bilateral C5 foraminal narrowing, moderate right worse than left C6 foraminal stenosis, with severe right and moderate left C7 foraminal narrowing.  PATIENT SURVEYS:  NDI 26% (MDC 5 points) Headache Disability Index: 38 (MDC 29 points)   COGNITION: Overall cognitive status: Within functional limits for tasks assessed   POSTURE:  WNL  PALPATION: Increased mm tightness in cervical and thoracic musculature, decreased segemental mobility in cervical spine   CERVICAL AROM/PROM  A/PROM A/PROM (deg) 09/07/2021  Flexion 60*  Extension 65*  Right lateral flexion 40*  Left lateral flexion 30*  Right rotation 68*  Left rotation 68*   (Blank rows = not tested, * =  pain)   UE MMT:  MMT Right 09/07/2021 Left 09/07/2021  Shoulder flexion 5 5  Shoulder extension    Shoulder abduction 5 5  Shoulder adduction    Shoulder extension 5 5  Middle trapezius    Lower trapezius    Elbow flexion 5 5  Elbow extension 5 5  Wrist flexion 5 5  Wrist extension 5 5  Wrist ulnar deviation    Wrist radial deviation    Wrist pronation    Wrist  supination    Grip strength WNL WNL   (Blank rows = not tested)  CERVICAL SPECIAL TESTS:  Spurling's test: Negative     TODAY'S TREATMENT:  Manual therapy: Soft tissue massage to L upper trap, levator scapulae, proximal portion of bil Sternoclaidomastoid Manually stretched anterior and middle scalenes on L and bil SCM TherEx: - Attempted SCM stretch but patient reported pain at base of neck so deferred - seated side bend stretch: 3 x 15" R and L, cues for light intensity of stretching - Cat and camel: 10x, tactile cueing to improve thoracic engagement - pt uses sit to stand desk at home, pt educated on making sure she stands with equal weight bearing and doesn't shift all her weight on One LE while standing. Pt educated on alternating sit and stand 30 min.   PATIENT EDUCATION:  Education details: see above Person educated: Patient Education method: Medical illustrator Education comprehension: verbalized understanding   HOME EXERCISE PROGRAM: Access Code [email or web address]  ASSESSMENT:  CLINICAL IMPRESSION: Patient is a 56 y.o. feamle who was seen today for physical therapy evaluation and treatment for chronic migraine and neck pain. Objective impairments include decreased ROM, increased fascial restrictions, increased muscle spasms, impaired flexibility, and pain . These impairments are limiting patient from cleaning, driving, occupation, and yard work. Personal factors including Time since onset of injury/illness/exacerbation are also affecting patient's functional outcome. Patient will benefit from skilled PT to address above impairments and improve overall function.  REHAB POTENTIAL: Good  CLINICAL DECISION MAKING: Stable/uncomplicated  EVALUATION COMPLEXITY: Low   GOALS: Goals reviewed with patient? Yes  SHORT TERM GOALS:  STG Name Target Date Goal status  1 Patient will report <5 headaches per week to improve overall function Baseline: headaches every day  10/05/2021 INITIAL  2 Patient will report compliance with HEP to self manage symptoms Baseline:  10/05/2021 INITIAL  3 Patient will report at least 20 % decrease in OTC pain medication to improve conservative self management of her symptoms Baseline: 10/05/2021 INITIAL   LONG TERM GOALS:   LTG Name Target Date Goal  status  1 Patient will report at least 5% improvement on NDI to improve overall function Baseline: NDI 26% (09/07/21) 11/02/2021 INITIAL  2 Pt will report at least 20 points improvement on HDI to improve overall function. Baseline:HDI = 38 (09/07/21) 11/02/2021 INITIAL  3 Patient will report <2 headaches per week to improve self management of headaches. Baseline:current frequency 7 days a week (09/07/21) 11/02/2021 INITIAL  4 Pt will report <3/10 pain neck pain to improve driving Baseline: 0-9/98 at worst (09/07/21) 11/02/2021 INITIAL   PLAN: PT FREQUENCY: 1-2x/week  PT DURATION: 8 weeks  PLANNED INTERVENTIONS: Therapeutic exercises, Therapeutic activity, Neuro Muscular re-education, Balance training, Gait training, Patient/Family education, Joint mobilization, Dry Needling, Electrical stimulation, Spinal mobilization, Cryotherapy, Moist heat, Traction, and Manual therapy  PLAN FOR NEXT SESSION: Soft tissue mobilization to neck, review stretches, assess for dry needling Bufford Lope)   Ileana Ladd, PT 09/07/2021, 7:31 PM

## 2021-09-08 ENCOUNTER — Encounter: Payer: Self-pay | Admitting: Neurology

## 2021-09-09 ENCOUNTER — Other Ambulatory Visit: Payer: Self-pay

## 2021-09-09 ENCOUNTER — Ambulatory Visit: Payer: BC Managed Care – PPO

## 2021-09-09 DIAGNOSIS — R519 Headache, unspecified: Secondary | ICD-10-CM | POA: Diagnosis not present

## 2021-09-09 DIAGNOSIS — M542 Cervicalgia: Secondary | ICD-10-CM

## 2021-09-09 DIAGNOSIS — G8929 Other chronic pain: Secondary | ICD-10-CM | POA: Diagnosis not present

## 2021-09-09 DIAGNOSIS — G43009 Migraine without aura, not intractable, without status migrainosus: Secondary | ICD-10-CM | POA: Diagnosis not present

## 2021-09-09 DIAGNOSIS — M4802 Spinal stenosis, cervical region: Secondary | ICD-10-CM | POA: Diagnosis not present

## 2021-09-09 NOTE — Therapy (Signed)
OUTPATIENT PHYSICAL THERAPY TREATMENT NOTE   Patient Name: Amber Richmond MRN: [id number] DOB:Jun 12, 1966, 56 y.o., female Today's Date: 09/09/2021  PCP: [name], MD REFERRING PROVIDER: [name], MD   PT End of Session - 09/09/21 1207     Visit Number 2    Number of Visits 17    Date for PT Re-Evaluation 11/02/21    PT Start Time 1105    PT Stop Time 1150    PT Time Calculation (min) 45 min    Activity Tolerance Patient tolerated treatment well    Behavior During Therapy [facility] for tasks assessed/performed             Past Medical History:  Diagnosis Date   Migraines    Past Surgical History:  Procedure Laterality Date   BREAST ENHANCEMENT SURGERY     GALLBLADDER SURGERY     tummy tuck     Patient Active Problem List   Diagnosis Date Noted   Cervical spinal stenosis 05/16/2021   Routine general medical examination at a health care facility 04/06/2021   New daily persistent headache 04/06/2021   Thrombocytopenia (HCC) 04/06/2021    REFERRING DIAG: G43.709 (ICD-10-CM) - Chronic migraine w/o aura w/o status migrainosus, not intractable M54.2,G89.29 (ICD-10-CM) - Chronic neck pain M54.2 (ICD-10-CM) - Myofascial neck pain M48.02 (ICD-10-CM) - Cervical stenosis of spinal canal   ONSET DATE: 08/15/21   THERAPY DIAG:  Neck pain  Chronic intractable headache, unspecified headache type  PERTINENT HISTORY: PERTINENT HISTORY:  PMHx migraines, cervical spinal stenosis.   PRECAUTIONS: none  SUBJECTIVE: I was sore after wards and had a headache but I felt more flexible.  PAIN:  Are you having pain? Yes VAS scale: 2/10 Pain location: neck Pain orientation: Right and Left  PAIN TYPE: aching and dull Pain description: constant  Aggravating factors: cervical ROM Relieving factors: pain medication   OBJECTIVE:  TODAY'S TREATMENT:  09/09/21: Soft tissue massage to bil upper trap, cervical paraspinalis Suboccipital release Manually stretched upper  trap Cat and camel: 10x Q-ped alternating UE lifts into flexion: 10x R and L Modified plank on elbows: 3 x 15" Seated with back unsupported: unilateral row: 15lbs R and L 15x, cues to engage scapula musculature and core Seated chest press, back unsupported, unilateral : 10lbs R and L 15x, cues to engage core  09/07/21 Manual therapy: Soft tissue massage to L upper trap, levator scapulae, proximal portion of bil Sternoclaidomastoid Manually stretched anterior and middle scalenes on L and bil SCM TherEx: - Attempted SCM stretch but patient reported pain at base of neck so deferred - seated side bend stretch: 3 x 15" R and L, cues for light intensity of stretching - Cat and camel: 10x, tactile cueing to improve thoracic engagement - pt uses sit to stand desk at home, pt educated on making sure she stands with equal weight bearing and doesn't shift all her weight on One LE while standing. Pt educated on alternating sit and stand 30 min.     PATIENT EDUCATION:  Education details: chronic pain education on how increased tissue sensitivity will cause her to feel more pain after massage and muscle soreness.  Person educated: Patient Education method: Medical illustrator Education comprehension: verbalized understanding     HOME EXERCISE PROGRAM: Access Code [email or web address]   ASSESSMENT:   CLINICAL IMPRESSION: Today's skilled session focused on continued manual therapy to improve soft tissue restrictions and reduce pain. Initiated core strengthening and proximal shoulder muscle strengthening and stabilization.Pt demo decreased core  control with L UE chest press and rowing compared to R UE   REHAB POTENTIAL: Good   CLINICAL DECISION MAKING: Stable/uncomplicated   EVALUATION COMPLEXITY: Low     GOALS: Goals reviewed with patient? Yes   SHORT TERM GOALS:   STG Name Target Date Goal status  1 Patient will report <5 headaches per week to improve overall function Baseline: headaches  every day 10/05/2021 INITIAL  2 Patient will report compliance with HEP to self manage symptoms Baseline:  10/05/2021 INITIAL  3 Patient will report at least 20 % decrease in OTC pain medication to improve conservative self management of her symptoms Baseline: 10/05/2021 INITIAL    LONG TERM GOALS:    LTG Name Target Date Goal status  1 Patient will report at least 5% improvement on NDI to improve overall function Baseline: NDI 26% (09/07/21) 11/02/2021 INITIAL  2 Pt will report at least 20 points improvement on HDI to improve overall function. Baseline:HDI = 38 (09/07/21) 11/02/2021 INITIAL  3 Patient will report <2 headaches per week to improve self management of headaches. Baseline:current frequency 7 days a week (09/07/21) 11/02/2021 INITIAL  4 Pt will report <3/10 pain neck pain to improve driving Baseline: 5-4/27 at worst (09/07/21) 11/02/2021 INITIAL    PLAN: PT FREQUENCY: 1-2x/week   PT DURATION: 8 weeks   PLANNED INTERVENTIONS: Therapeutic exercises, Therapeutic activity, Neuro Muscular re-education, Balance training, Gait training, Patient/Family education, Joint mobilization, Dry Needling, Electrical stimulation, Spinal mobilization, Cryotherapy, Moist heat, Traction, and Manual therapy   PLAN FOR NEXT SESSION: Soft tissue mobilization to neck, review stretches, assess for dry needling Bufford Lope)   Ileana Ladd 09/09/2021, 12:08 PM

## 2021-09-12 DIAGNOSIS — G43009 Migraine without aura, not intractable, without status migrainosus: Secondary | ICD-10-CM | POA: Insufficient documentation

## 2021-09-12 MED ORDER — UBRELVY 100 MG PO TABS: 100.0000 mg | ORAL_TABLET | ORAL | 11 refills | Status: DC | PRN

## 2021-09-12 MED ORDER — QULIPTA 60 MG PO TABS: 60.0000 mg | ORAL_TABLET | Freq: Every day | ORAL | 11 refills | Status: DC

## 2021-09-12 NOTE — Addendum Note (Signed)
Addended by: Sarina Ill B on: 09/12/2021 05:58 PM   Modules accepted: Orders

## 2021-09-16 ENCOUNTER — Ambulatory Visit: Payer: BC Managed Care – PPO

## 2021-09-23 ENCOUNTER — Encounter: Payer: Self-pay | Admitting: Physical Therapy

## 2021-09-23 ENCOUNTER — Other Ambulatory Visit: Payer: Self-pay

## 2021-09-23 ENCOUNTER — Ambulatory Visit: Payer: BC Managed Care – PPO | Admitting: Physical Therapy

## 2021-09-23 DIAGNOSIS — M542 Cervicalgia: Secondary | ICD-10-CM

## 2021-09-23 DIAGNOSIS — R519 Headache, unspecified: Secondary | ICD-10-CM | POA: Diagnosis not present

## 2021-09-23 DIAGNOSIS — M4802 Spinal stenosis, cervical region: Secondary | ICD-10-CM | POA: Diagnosis not present

## 2021-09-23 DIAGNOSIS — G43009 Migraine without aura, not intractable, without status migrainosus: Secondary | ICD-10-CM | POA: Diagnosis not present

## 2021-09-23 DIAGNOSIS — G8929 Other chronic pain: Secondary | ICD-10-CM | POA: Diagnosis not present

## 2021-09-23 NOTE — Therapy (Signed)
[facility] [address] [place], [place], [address] Phone: [phone]   Fax:  [phone]  Patient Details  Name: Amber Richmond MRN: [id number] Date of Birth: Aug 09, 1966 Referring Provider:  Anson Fret, MD  Encounter Date: 09/23/2021   OUTPATIENT PHYSICAL THERAPY TREATMENT NOTE   Patient Name: Amber Richmond MRN: [id number] DOB:[birth year], 56 y.o., female Today's Date: 09/23/2021  PCP: [name], MD REFERRING PROVIDER: Anson Fret, MD   PT End of Session - 09/23/21 1809     Visit Number 3    Number of Visits 17    Date for PT Re-Evaluation 11/02/21    Authorization Type [employer] 2023 - PT/OT/ST 90 visit limit combined; hard max    PT Start Time 1445    PT Stop Time 1535    PT Time Calculation (min) 50 min    Equipment Utilized During Treatment Other (comment)   dry needles   Activity Tolerance Patient tolerated treatment well    Behavior During Therapy [facility] for tasks assessed/performed              Past Medical History:  Diagnosis Date   Migraines    Past Surgical History:  Procedure Laterality Date   BREAST ENHANCEMENT SURGERY     GALLBLADDER SURGERY     tummy tuck     Patient Active Problem List   Diagnosis Date Noted   Migraine without aura and without status migrainosus, not intractable 09/12/2021   Cervical spinal stenosis 05/16/2021   Routine general medical examination at a health care facility 04/06/2021   New daily persistent headache 04/06/2021   Thrombocytopenia (HCC) 04/06/2021    REFERRING DIAG: G43.709 (ICD-10-CM) - Chronic migraine w/o aura w/o status migrainosus, not intractable M54.2,G89.29 (ICD-10-CM) - Chronic neck pain M54.2 (ICD-10-CM) - Myofascial neck pain M48.02 (ICD-10-CM) - Cervical stenosis of spinal canal   ONSET DATE: 08/15/21   THERAPY DIAG:  Cervicalgia  Chronic intractable headache, unspecified headache type  PERTINENT HISTORY: PERTINENT  HISTORY:  PMHx migraines, cervical spinal stenosis.   PRECAUTIONS: none  SUBJECTIVE: Pt is feeling better, has had a couple days of stiffness but has begun to do a little bit of resistance exercises with 4lb.  Actually feels stiff if she doesn't do them.    PAIN:  Are you having pain?  No; just feel stiff VAS scale:  Pain location:  Pain orientation:  PAIN TYPE:  Pain description:  Aggravating factors:  Relieving factors:    OBJECTIVE:  TODAY'S TREATMENT:  09/23/21:    09/23/21 1804  Self-Care  Self-Care Other Self-Care Comments  Other Self-Care Comments  Following dry needling guided pt through 4 count diaphragmatic breathing and alternating tapping for activation of parasympathetic nervous system.  Therapeutic Activites   Therapeutic Activities Other Therapeutic Activities  Other Therapeutic Activities Provided pt trigger point dry needling educational handout and discussed purpose and use of dry needling, possible side effects, ways to mitigate side effects, treatment goals and screened pt for any precautions or contraindications: none identified.  Pt consented to use of dry needling today for treatment.     09/23/21 1807  Trigger Point Dry Needling  Consent Given? Yes  Education Handout Provided Yes  Muscles Treated Head and Neck Upper trapezius;Temporalis  Dry Needling Comments performed in supine; performed to R upper trapezius.  Following UT treatment pt reported increased HA in L temple; pt noted to have painful myofascial trigger point in L temporalis.  Performed dry needling to L temporalis  Upper Trapezius Response Twitch reponse elicited;Palpable increased muscle length  Temporalis Response Twitch reponse elicited     09/09/21: Soft tissue massage to bil upper trap, cervical paraspinalis Suboccipital release Manually stretched upper trap Cat and camel: 10x Q-ped alternating UE lifts into flexion: 10x R and L Modified plank on elbows: 3 x 15" Seated with back  unsupported: unilateral row: 15lbs R and L 15x, cues to engage scapula musculature and core Seated chest press, back unsupported, unilateral : 10lbs R and L 15x, cues to engage core    PATIENT EDUCATION:  Education details: See TA and Self-Care  Person educated: Patient Education method: Explanation and Demonstration Education comprehension: verbalized understanding     HOME EXERCISE PROGRAM: Access Code [email or web address]   ASSESSMENT:   CLINICAL IMPRESSION:  Pt tolerated initial dry needling session to address painful myofascial trigger points, headaches and limitations in neck ROM.  Following dry needling pt demonstrated increased active rotation to R side but ongoing limitations in L rotation.  Pt may benefit from one more dry needling session to address L upper trap.   REHAB POTENTIAL: Good   CLINICAL DECISION MAKING: Stable/uncomplicated   EVALUATION COMPLEXITY: Low     GOALS: Goals reviewed with patient? Yes   SHORT TERM GOALS:   STG Name Target Date Goal status  1 Patient will report <5 headaches per week to improve overall function Baseline: headaches every day 10/05/2021 INITIAL  2 Patient will report compliance with HEP to self manage symptoms Baseline:  10/05/2021 INITIAL  3 Patient will report at least 20 % decrease in OTC pain medication to improve conservative self management of her symptoms Baseline: 10/05/2021 INITIAL    LONG TERM GOALS:    LTG Name Target Date Goal status  1 Patient will report at least 5% improvement on NDI to improve overall function Baseline: NDI 26% (09/07/21) 11/02/2021 INITIAL  2 Pt will report at least 20 points improvement on HDI to improve overall function. Baseline:HDI = 38 (09/07/21) 11/02/2021 INITIAL  3 Patient will report <2 headaches per week to improve self management of headaches. Baseline:current frequency 7 days a week (09/07/21) 11/02/2021 INITIAL  4 Pt will report <3/10 pain neck pain to improve driving Baseline: 8-5/02 at worst (09/07/21)  11/02/2021 INITIAL    PLAN: PT FREQUENCY: 1-2x/week   PT DURATION: 8 weeks   PLANNED INTERVENTIONS: Therapeutic exercises, Therapeutic activity, Neuro Muscular re-education, Balance training, Gait training, Patient/Family education, Joint mobilization, Dry Needling, Electrical stimulation, Spinal mobilization, Cryotherapy, Moist heat, Traction, and Manual therapy   PLAN FOR NEXT SESSION: Soft tissue mobilization to neck, review stretches, Karmesh - let me know how she felt after dry needling.  She may want to do L side  Dierdre Highman, PT, DPT 09/23/21    6:14 PM    [facility] [address] [place], [place], [address] Phone: [phone]   Fax:  [phone]

## 2021-09-23 NOTE — Patient Instructions (Signed)

## 2021-09-28 ENCOUNTER — Ambulatory Visit: Payer: BC Managed Care – PPO

## 2021-09-28 NOTE — Telephone Encounter (Signed)
Spoke to pt and she was able to get Qulipta from her local pharmacist at same or lower price as myscripts, and has been taking this, doing great. Also was able to get ubrelvy as well and uses it when she needs it, works better then nurtec.  She is so appreciative.  So sorry that she did not read your message.  I relayed will let her know, but I relayed to read the mychart message as well.  She said she would.

## 2021-09-29 NOTE — Therapy (Signed)
[facility] [address] [place], [place], [address] Phone: [phone]   Fax:  [phone]  Physical Therapy Treatment  Patient Details  Name: Amber Richmond [facility] [address] [place], [place], [address] Phone: [phone]   Fax:  [phone]  Patient Details  Name: Amber Richmond MRN: [id number] Date of Birth: Dec 02, 1965 Referring Provider:  [name], MD  Encounter Date: 09/30/2021   OUTPATIENT PHYSICAL THERAPY TREATMENT NOTE   Patient Name: Amber Richmond MRN: [id number] DOB:01-06-1966, 56 y.o., female Today's Date: 09/30/2021  PCP: [name], MD REFERRING PROVIDER: [name], MD   PT End of Session - 09/30/21 1320     Visit Number 4    Number of Visits 17    Date for PT Re-Evaluation 11/02/21    Authorization Type BCBS 2023 - PT/OT/ST 90 visit limit combined; hard max    PT Start Time 1318    PT Stop Time 1357    PT Time Calculation (min) 39 min    Equipment Utilized During Treatment --    Activity Tolerance Patient tolerated treatment well    Behavior During Therapy WFL for tasks assessed/performed               Past Medical History:  Diagnosis Date   Migraines    Past Surgical History:  Procedure Laterality Date   BREAST ENHANCEMENT SURGERY     GALLBLADDER SURGERY     tummy tuck     Patient Active Problem List   Diagnosis Date Noted   Migraine without aura and without status migrainosus, not intractable 09/12/2021   Cervical spinal stenosis 05/16/2021   Routine general medical examination at a health care facility 04/06/2021   New daily persistent headache 04/06/2021   Thrombocytopenia (HCC) 04/06/2021    REFERRING DIAG: G43.709 (ICD-10-CM) - Chronic migraine w/o aura w/o status migrainosus, not intractable M54.2,G89.29 (ICD-10-CM) - Chronic neck pain M54.2 (ICD-10-CM) - Myofascial neck  pain M48.02 (ICD-10-CM) - Cervical stenosis of spinal canal   ONSET DATE: 08/15/21   THERAPY DIAG:  Cervicalgia  PERTINENT HISTORY: PERTINENT HISTORY:  PMHx migraines, cervical spinal stenosis.   PRECAUTIONS: none  SUBJECTIVE: Pt reports she had a fall on Monday on to cement tripping over her dog. Caught herself with arms and did not hit her head. She has been waking up with more pain and migraines each day since then. Not sure if related to dry needling or the fall. Pt reports that she is also doing exercises with arms with 4# weights- curls, deadlifts, tricep curls.   PAIN:  Are you having pain?  Yes, left posterior neck at base of head.  VAS scale: 4 Pain location:  Pain orientation:  PAIN TYPE:  Pain description: "I'm aware of it", constant Aggravating factors:  Relieving factors:    OBJECTIVE:  TODAY'S TREATMENT:  09/30/21:  Cervical rotation at beginning of session: Right=70 degrees and left=50  Supine manual techniques to cervical spine: suboccipital release 30 sec x 2, cervical traction focusing on C1/C2 area 30 sec x 3, lateral glides on left for closing at C2-4 grade 3 3 bouts of 20 sec in neutral then at repeated at end range left cervical rotation and at end range left sidebend. Noted to be hypomobile. STM to left upper trap insertion x 2 min at occiput. Passive left upper trap stretch 30 sec x 3.  Discussed ergonomics at desk at home. Does not have standing desk there.  Does report that monitor is right in front and she uses a lumbar support in her chair to help with maintaining upright posture.  Supine cervical retraction in to pillow x 10 with verbal cues and demonstration for form. Pt did well picking up on technique and did report feeling stretching at base of head. Seated Cervical Retraction x 6 reps with verbal cuing for form.   After treatment cervical rotation:  right=72 degrees and left=60 degrees with pt reporting it feeling better and able to move  easier       09/23/21:    09/23/21 1804  Self-Care  Self-Care Other Self-Care Comments  Other Self-Care Comments  Following dry needling guided pt through 4 count diaphragmatic breathing and alternating tapping for activation of parasympathetic nervous system.  Therapeutic Activites   Therapeutic Activities Other Therapeutic Activities  Other Therapeutic Activities Provided pt trigger point dry needling educational handout and discussed purpose and use of dry needling, possible side effects, ways to mitigate side effects, treatment goals and screened pt for any precautions or contraindications: none identified.  Pt consented to use of dry needling today for treatment.     09/23/21 1807  Trigger Point Dry Needling  Consent Given? Yes  Education Handout Provided Yes  Muscles Treated Head and Neck Upper trapezius;Temporalis  Dry Needling Comments performed in supine; performed to R upper trapezius.  Following UT treatment pt reported increased HA in L temple; pt noted to have painful myofascial trigger point in L temporalis.  Performed dry needling to L temporalis  Upper Trapezius Response Twitch reponse elicited;Palpable increased muscle length  Temporalis Response Twitch reponse elicited         PATIENT EDUCATION:  Education details: Ergonomics at desk at home and added cervical retraction exercise. Person educated: Patient Education method: Medical illustratorxplanation and Demonstration and handout Education comprehension: verbalized understanding, return demonstration     HOME EXERCISE PROGRAM: Access Code [email or web address]   Exercises Seated Cervical Sidebending Stretch - 2-3 x daily - 7 x weekly - 3 reps - 15 sec hold Cat Cow - 2 x daily - 7 x weekly - 10 reps Seated Passive Cervical Retraction - 3 x daily - 7 x weekly - 1 sets - 10 reps    ASSESSMENT:   CLINICAL IMPRESSION:  Pt had decreased left cervical rotation today and appeared to have difficulty with closing down on left. Pt was  noted to be hypomobile with left lateral glides. Focused on manual techniques to improve this and pt had improved rotation by 10 degrees at end with reports of feeling better with easier movements. Added cervical retraction to work on posture and deep neck flexor strengthening with stretching at occiput.   REHAB POTENTIAL: Good   CLINICAL DECISION MAKING: Stable/uncomplicated   EVALUATION COMPLEXITY: Low     GOALS: Goals reviewed with patient? Yes   SHORT TERM GOALS:   STG Name Target Date Goal status  1 Patient will report <5 headaches per week to improve overall function Baseline: headaches every day 10/05/2021 INITIAL  2 Patient will report compliance with HEP to self manage symptoms Baseline:  10/05/2021 INITIAL  3 Patient will report at least 20 % decrease in OTC pain medication to improve conservative self management of her symptoms Baseline: 10/05/2021 INITIAL    LONG TERM GOALS:    LTG Name Target Date Goal status  1 Patient will report at least 5% improvement on NDI to improve overall function Baseline: NDI 26% (09/07/21) 11/02/2021 INITIAL  2 Pt will report at  least 20 points improvement on HDI to improve overall function. Baseline:HDI = 38 (09/07/21) 11/02/2021 INITIAL  3 Patient will report <2 headaches per week to improve self management of headaches. Baseline:current frequency 7 days a week (09/07/21) 11/02/2021 INITIAL  4 Pt will report <3/10 pain neck pain to improve driving Baseline: 0-6/30 at worst (09/07/21) 11/02/2021 INITIAL    PLAN: PT FREQUENCY: 1-2x/week   PT DURATION: 8 weeks   PLANNED INTERVENTIONS: Therapeutic exercises, Therapeutic activity, Neuro Muscular re-education, Balance training, Gait training, Patient/Family education, Joint mobilization, Dry Needling, Electrical stimulation, Spinal mobilization, Cryotherapy, Moist heat, Traction, and Manual therapy   PLAN FOR NEXT SESSION: Check STGs. How long did the manual techniques show benefit? Continue with manual  techniques and stretching to cervical spine. Continue to focus on more postural exercises for pt to be able to do at home.  Elmer Bales, PT, DPT, NCS  09/30/21    2:25 PM    [facility] [address] [place], [place], [address] Phone: [phone]   Fax:  [phone] MRN: [id number] Date of Birth: [birth year] No data recorded                                                    Name: Amber Richmond MRN: [id number] Date of Birth: 15-Jun-1966

## 2021-09-30 ENCOUNTER — Other Ambulatory Visit: Payer: Self-pay

## 2021-09-30 ENCOUNTER — Ambulatory Visit: Payer: BC Managed Care – PPO

## 2021-09-30 DIAGNOSIS — M542 Cervicalgia: Secondary | ICD-10-CM | POA: Diagnosis not present

## 2021-09-30 DIAGNOSIS — M4802 Spinal stenosis, cervical region: Secondary | ICD-10-CM | POA: Diagnosis not present

## 2021-09-30 DIAGNOSIS — G8929 Other chronic pain: Secondary | ICD-10-CM | POA: Diagnosis not present

## 2021-09-30 DIAGNOSIS — G43009 Migraine without aura, not intractable, without status migrainosus: Secondary | ICD-10-CM | POA: Diagnosis not present

## 2021-09-30 DIAGNOSIS — R519 Headache, unspecified: Secondary | ICD-10-CM | POA: Diagnosis not present

## 2021-09-30 NOTE — Patient Instructions (Signed)
Access Code: [email or web address] URL: [email or web address] Date: 09/30/2021 Prepared by: Elmer Bales  Exercises Seated Cervical Sidebending Stretch - 2-3 x daily - 7 x weekly - 3 reps - 15 sec hold Cat Cow - 2 x daily - 7 x weekly - 10 reps Seated Passive Cervical Retraction - 3 x daily - 7 x weekly - 1 sets - 10 reps

## 2021-10-05 ENCOUNTER — Ambulatory Visit: Payer: BC Managed Care – PPO

## 2021-10-07 ENCOUNTER — Ambulatory Visit: Payer: BC Managed Care – PPO | Attending: Internal Medicine

## 2021-10-07 ENCOUNTER — Other Ambulatory Visit: Payer: Self-pay

## 2021-10-07 DIAGNOSIS — M542 Cervicalgia: Secondary | ICD-10-CM | POA: Diagnosis not present

## 2021-10-07 DIAGNOSIS — R519 Headache, unspecified: Secondary | ICD-10-CM | POA: Insufficient documentation

## 2021-10-07 DIAGNOSIS — G8929 Other chronic pain: Secondary | ICD-10-CM

## 2021-10-07 NOTE — Therapy (Signed)
[facility] [address] [place], [place], [address] Phone: [phone]   Fax:  [phone]   OUTPATIENT PHYSICAL THERAPY TREATMENT NOTE   Patient Name: Amber Richmond MRN: [id number] DOB:21-Aug-1966, 56 y.o., female Today's Date: 10/07/2021  PCP: Janith Lima, MD REFERRING PROVIDER: Janith Lima, MD   PT End of Session - 10/07/21 1503     Visit Number 5    Number of Visits 17    Date for PT Re-Evaluation 11/02/21    Authorization Type BCBS 2023 - PT/OT/ST 90 visit limit combined; hard max    PT Start Time 1320    PT Stop Time 1400    PT Time Calculation (min) 40 min    Activity Tolerance Patient tolerated treatment well    Behavior During Therapy WFL for tasks assessed/performed               Past Medical History:  Diagnosis Date   Migraines    Past Surgical History:  Procedure Laterality Date   BREAST ENHANCEMENT SURGERY     GALLBLADDER SURGERY     tummy tuck     Patient Active Problem List   Diagnosis Date Noted   Migraine without aura and without status migrainosus, not intractable 09/12/2021   Cervical spinal stenosis 05/16/2021   Routine general medical examination at a health care facility 04/06/2021   New daily persistent headache 04/06/2021   Thrombocytopenia ([place]) 04/06/2021    REFERRING DIAG: G43.709 (ICD-10-CM) - Chronic migraine w/o aura w/o status migrainosus, not intractable M54.2,G89.29 (ICD-10-CM) - Chronic neck pain M54.2 (ICD-10-CM) - Myofascial neck pain M48.02 (ICD-10-CM) - Cervical stenosis of spinal canal   ONSET DATE: 08/15/21   THERAPY DIAG:  Cervicalgia  Chronic intractable headache, unspecified headache type  Neck pain  PERTINENT HISTORY: PERTINENT HISTORY:  PMHx migraines, cervical spinal stenosis.   PRECAUTIONS: none  SUBJECTIVE: Pt reports dry needling helped for about a day or two. After last session, I felt I was able to move my neck better for 1-2  days. For last 3-4 days I have been waking up with headaches in the morning. Pt inquiring about what pillows to use.  PAIN:  Are you having pain?  Yes, left posterior neck at base of head.  VAS scale: 2 Pain location:  Pain orientation:  PAIN TYPE:  Pain description: "I'm aware of it", constant Aggravating factors:  Relieving factors:    OBJECTIVE:  TODAY'S TREATMENT:  10/07/21: Soft tissue mobilization to bil suboccipital region, cervical paraspinalis, middle and posterior scalenes bil, levator scapule and upper traps bil Passive stretching of above muscles in supine positions bil, utilized active release with trigger point pressure with cervical rotations, shoulder shrugs R sidelying passive scapular elevation and posterior depression to improve scapular mobility and passive stretch of cervical musculature during posterior depression Pt educated on using feather pillows and pulling the edge of the pillow into neck to provide support in supine and sidelying positions. Pt educated on making sure shoulders are not on the pillow. Maintaining neutral curvature in c-spine  09/30/21:  Cervical rotation at beginning of session: Right=70 degrees and left=50  Supine manual techniques to cervical spine: suboccipital release 30 sec x 2, cervical traction focusing on C1/C2 area 30 sec x 3, lateral glides on left for closing at C2-4 grade 3 3 bouts of 20 sec in neutral then at repeated at end range left cervical rotation and at end range left sidebend. Noted to be hypomobile. STM to left upper  trap insertion x 2 min at occiput. Passive left upper trap stretch 30 sec x 3.  Discussed ergonomics at desk at home. Does not have standing desk there. Does report that monitor is right in front and she uses a lumbar support in her chair to help with maintaining upright posture.  Supine cervical retraction in to pillow x 10 with verbal cues and demonstration for form. Pt did well picking up on technique and did  report feeling stretching at base of head. Seated Cervical Retraction x 6 reps with verbal cuing for form.   After treatment cervical rotation:  right=72 degrees and left=60 degrees with pt reporting it feeling better and able to move easier       09/23/21:    09/23/21 1804  Self-Care  Self-Care Other Self-Care Comments  Other Self-Care Comments  Following dry needling guided pt through 4 count diaphragmatic breathing and alternating tapping for activation of parasympathetic nervous system.  Therapeutic Activites   Therapeutic Activities Other Therapeutic Activities  Other Therapeutic Activities Provided pt trigger point dry needling educational handout and discussed purpose and use of dry needling, possible side effects, ways to mitigate side effects, treatment goals and screened pt for any precautions or contraindications: none identified.  Pt consented to use of dry needling today for treatment.     09/23/21 1807  Trigger Point Dry Needling  Consent Given? Yes  Education Handout Provided Yes  Muscles Treated Head and Neck Upper trapezius;Temporalis  Dry Needling Comments performed in supine; performed to R upper trapezius.  Following UT treatment pt reported increased HA in L temple; pt noted to have painful myofascial trigger point in L temporalis.  Performed dry needling to L temporalis  Upper Trapezius Response Twitch reponse elicited;Palpable increased muscle length  Temporalis Response Twitch reponse elicited         PATIENT EDUCATION:  Education details: Ergonomics at desk at home and added cervical retraction exercise. Person educated: Patient Education method: Customer service manager and handout Education comprehension: verbalized understanding, return demonstration     HOME EXERCISE PROGRAM: Access Code [email or web address]   Exercises Seated Cervical Sidebending Stretch - 2-3 x daily - 7 x weekly - 3 reps - 15 sec hold Cat Cow - 2 x daily - 7 x weekly - 10  reps Seated Passive Cervical Retraction - 3 x daily - 7 x weekly - 1 sets - 10 reps    ASSESSMENT:   CLINICAL IMPRESSION:  Pt reported improved flexibility throughout neck and shoulders at end of the session. Pt reporting very tender points over middle and posterior scalenes, levator scapulae and upper trap bil. Pt demo decreased scapular posterior depression mobility on L>R   REHAB POTENTIAL: Good   CLINICAL DECISION MAKING: Stable/uncomplicated   EVALUATION COMPLEXITY: Low     GOALS: Goals reviewed with patient? Yes   SHORT TERM GOALS:   STG Name Target Date Goal status  1 Patient will report <5 headaches per week to improve overall function Baseline: headaches every day, headaches are intermittent but reporting headaches over last 3 days 10/05/2021 On going  2 Patient will report compliance with HEP to self manage symptoms Baseline:  10/05/2021 Goal met  3 Patient will report at least 20 % decrease in OTC pain medication to improve conservative self management of her symptoms Baseline: 10/05/2021 On going    LONG TERM GOALS:    LTG Name Target Date Goal status  1 Patient will report at least 5% improvement on NDI to improve overall function  Baseline: NDI 26% (09/07/21) 11/02/2021 INITIAL  2 Pt will report at least 20 points improvement on HDI to improve overall function. Baseline:HDI = 38 (09/07/21) 11/02/2021 INITIAL  3 Patient will report <2 headaches per week to improve self management of headaches. Baseline:current frequency 7 days a week (09/07/21) 11/02/2021 INITIAL  4 Pt will report <3/10 pain neck pain to improve driving Baseline: 7-1/99 at worst (09/07/21) 11/02/2021 INITIAL    PLAN: PT FREQUENCY: 1-2x/week   PT DURATION: 8 weeks   PLANNED INTERVENTIONS: Therapeutic exercises, Therapeutic activity, Neuro Muscular re-education, Balance training, Gait training, Patient/Family education, Joint mobilization, Dry Needling, Electrical stimulation, Spinal mobilization, Cryotherapy, Moist  heat, Traction, and Manual therapy   PLAN FOR NEXT SESSION:  Continue to focus on more postural exercises for pt to be able to do at home.   Markus Jarvis, PT  10/07/21    3:04 PM    [facility] [address] [place], [place], [address] Phone: [phone]   Fax:  [phone] MRN: [id number] Date of Birth: 1966-02-23 No data recorded                                              Name: Halia Franey MRN: [id number] Date of Birth: 1965/12/30

## 2021-10-10 NOTE — Therapy (Deleted)
OUTPATIENT PHYSICAL THERAPY LOWER EXTREMITY EVALUATION   Patient Name: Amber Richmond MRN: [id number] DOB:09-Jun-1966, 56 y.o., female Today's Date: 10/10/2021    Past Medical History:  Diagnosis Date   Migraines    Past Surgical History:  Procedure Laterality Date   BREAST ENHANCEMENT SURGERY     GALLBLADDER SURGERY     tummy tuck     Patient Active Problem List   Diagnosis Date Noted   Migraine without aura and without status migrainosus, not intractable 09/12/2021   Cervical spinal stenosis 05/16/2021   Routine general medical examination at a health care facility 04/06/2021   New daily persistent headache 04/06/2021   Thrombocytopenia (HCC) 04/06/2021    PCP: [name], MD  REFERRING PROVIDER: [name], MD  REFERRING DIAG: ***  THERAPY DIAG:  No diagnosis found.  ONSET DATE: ***  SUBJECTIVE:   SUBJECTIVE STATEMENT: ***  PERTINENT HISTORY: ***  PAIN:  Are you having pain? {yes/no:20286} NPRS scale: ***/10 Pain location: *** Pain orientation: {Pain Orientation:25161}  PAIN TYPE: {type:313116} Pain description: {PAIN DESCRIPTION:21022940}  Aggravating factors: *** Relieving factors: ***  PRECAUTIONS: {Therapy precautions:24002}  WEIGHT BEARING RESTRICTIONS {Yes ***/No:24003}  FALLS:  Has patient fallen in last 6 months? {yes/no:20286}, Number of falls: ***  LIVING ENVIRONMENT: Lives with: {[facility] lives with:25569::"lives with their family"} Lives in: {Lives in:25570} Stairs: {yes/no:20286}; {Stairs:24000} Has following equipment at home: {Assistive devices:23999}  OCCUPATION: ***  PLOF: {PLOF:24004}  PATIENT GOALS ***   OBJECTIVE:   DIAGNOSTIC FINDINGS: ***  PATIENT SURVEYS:  {rehab surveys:24030}  COGNITION:  Overall cognitive status: {cognition:24006}     SENSATION:  Light touch: {intact/deficits:24005}  Stereognosis: {intact/deficits:24005}  Hot/Cold: {intact/deficits:24005}  Proprioception:  {intact/deficits:24005}  MUSCLE LENGTH: Hamstrings: Right *** deg; Left *** deg Thomas test: Right *** deg; Left *** deg  POSTURE:  ***  PALPATION: ***  LE AROM/PROM:  A/PROM Right 10/10/2021 Left 10/10/2021 Right 10/10/2021    Hip flexion   .   Hip extension      Hip abduction      Hip adduction      Hip internal rotation      Hip external rotation      Knee flexion      Knee extension      Ankle dorsiflexion      Ankle plantarflexion      Ankle inversion      Ankle eversion       (Blank rows = not tested)  LE MMT:  MMT Right 10/10/2021 Left 10/10/2021  Hip flexion    Hip extension    Hip abduction    Hip adduction    Hip internal rotation    Hip external rotation    Knee flexion    Knee extension    Ankle dorsiflexion    Ankle plantarflexion    Ankle inversion    Ankle eversion     (Blank rows = not tested)  LOWER EXTREMITY SPECIAL TESTS:  {LEspecialtests:26242}  FUNCTIONAL TESTS:  {Functional tests:24029}  GAIT: Distance walked: *** Assistive device utilized: {Assistive devices:23999} Level of assistance: {Levels of assistance:24026} Comments: ***    TODAY'S TREATMENT: ***   PATIENT EDUCATION:  Education details: *** Person educated: {Person educated:25204} Education method: {Education Method:25205} Education comprehension: {Education Comprehension:25206}   HOME EXERCISE PROGRAM: ***  ASSESSMENT:  CLINICAL IMPRESSION: Patient is a *** y.o. *** who was seen today for physical therapy evaluation and treatment for ***. Objective impairments include {opptimpairments:25111}. These impairments are limiting patient from {activity limitations:25113}. Personal factors including {  Personal factors:25162} are also affecting patient's functional outcome. Patient will benefit from skilled PT to address above impairments and improve overall function.  REHAB POTENTIAL: {rehabpotential:25112}  CLINICAL DECISION MAKING: {clinical decision  making:25114}  EVALUATION COMPLEXITY: {Evaluation complexity:25115}   GOALS: Goals reviewed with patient? {yes/no:20286}  SHORT TERM GOALS:  STG Name Target Date Goal status  1 *** Baseline:  {follow up:25551} {GOALSTATUS:25110}  2 *** Baseline:  {follow up:25551} {GOALSTATUS:25110}  3 *** Baseline: {follow up:25551} {GOALSTATUS:25110}  4 *** Baseline: {follow up:25551} {GOALSTATUS:25110}  5 *** Baseline: {follow up:25551} {GOALSTATUS:25110}  6 *** Baseline: {follow up:25551} {GOALSTATUS:25110}  7 *** Baseline: {follow up:25551} {GOALSTATUS:25110}   LONG TERM GOALS:   LTG Name Target Date Goal status  1 *** Baseline: {follow up:25551} {GOALSTATUS:25110}  2 *** Baseline: {follow up:25551} {GOALSTATUS:25110}  3 *** Baseline: {follow up:25551} {GOALSTATUS:25110}  4 *** Baseline: {follow up:25551} {GOALSTATUS:25110}  5 *** Baseline: {follow up:25551} {GOALSTATUS:25110}  6 *** Baseline: {follow up:25551} {GOALSTATUS:25110}  7 *** Baseline: {follow up:25551} {GOALSTATUS:25110}   PLAN: PT FREQUENCY: {rehab frequency:25116}  PT DURATION: {rehab duration:25117}  PLANNED INTERVENTIONS: {rehab planned interventions:25118::"Therapeutic exercises","Therapeutic activity","Neuro Muscular re-education","Balance training","Gait training","Patient/Family education","Joint mobilization"}  PLAN FOR NEXT SESSION: Ileana Ladd, PT 10/10/2021, 9:51 AM

## 2021-10-12 ENCOUNTER — Ambulatory Visit: Payer: BC Managed Care – PPO

## 2021-10-14 ENCOUNTER — Ambulatory Visit: Payer: BC Managed Care – PPO

## 2021-10-19 ENCOUNTER — Other Ambulatory Visit: Payer: Self-pay

## 2021-10-19 ENCOUNTER — Ambulatory Visit: Payer: BC Managed Care – PPO

## 2021-10-19 DIAGNOSIS — M542 Cervicalgia: Secondary | ICD-10-CM | POA: Diagnosis not present

## 2021-10-19 DIAGNOSIS — G8929 Other chronic pain: Secondary | ICD-10-CM | POA: Diagnosis not present

## 2021-10-19 DIAGNOSIS — R519 Headache, unspecified: Secondary | ICD-10-CM

## 2021-10-19 NOTE — Therapy (Signed)
[facility] [address] [place], [place], [address] Phone: [phone]   Fax:  [phone]   OUTPATIENT PHYSICAL THERAPY TREATMENT NOTE   Patient Name: Amber Richmond MRN: [id number] DOB:26-Nov-1965, 56 y.o., female Today's Date: 10/19/2021  PCP: Janith Lima, MD REFERRING PROVIDER: Melvenia Beam, MD   PT End of Session - 10/19/21 1331     Visit Number 6    Number of Visits 17    Date for PT Re-Evaluation 11/02/21    Authorization Type BCBS 2023 - PT/OT/ST 90 visit limit combined; hard max    PT Start Time 1330    PT Stop Time 1400    PT Time Calculation (min) 30 min    Activity Tolerance Patient tolerated treatment well    Behavior During Therapy WFL for tasks assessed/performed               Past Medical History:  Diagnosis Date   Migraines    Past Surgical History:  Procedure Laterality Date   BREAST ENHANCEMENT SURGERY     GALLBLADDER SURGERY     tummy tuck     Patient Active Problem List   Diagnosis Date Noted   Migraine without aura and without status migrainosus, not intractable 09/12/2021   Cervical spinal stenosis 05/16/2021   Routine general medical examination at a health care facility 04/06/2021   New daily persistent headache 04/06/2021   Thrombocytopenia ([place]) 04/06/2021    REFERRING DIAG: G43.709 (ICD-10-CM) - Chronic migraine w/o aura w/o status migrainosus, not intractable M54.2,G89.29 (ICD-10-CM) - Chronic neck pain M54.2 (ICD-10-CM) - Myofascial neck pain M48.02 (ICD-10-CM) - Cervical stenosis of spinal canal   ONSET DATE: 08/15/21   THERAPY DIAG:  Cervicalgia  Chronic intractable headache, unspecified headache type  Neck pain  PERTINENT HISTORY: PERTINENT HISTORY:  PMHx migraines, cervical spinal stenosis.   PRECAUTIONS: none  SUBJECTIVE: She has been sick for past couple of weeks and very congested. She has been having headaches everyday but they are  different. She hasn't had a mirgraine as she hasn't had need to take mirgaine meds but just taking OTC pain meds for headaches. Using pillows properly has helped her manage neck pain at night and she is sleeping better as a result.  PAIN:  Are you having pain?  Yes, left posterior neck at base of head.  VAS scale: 2 Pain location:  Pain orientation:  PAIN TYPE:  Pain description: "I'm aware of it", constant Aggravating factors:  Relieving factors:    OBJECTIVE:  TODAY'S TREATMENT:   10/19/21: Soft tissue mobilization to bil suboccipital region, cervical paraspinalis, middle and posterior scalenes bil, levator scapule and upper traps bil Passive stretching of above muscles in supine positions bil, utilized active release with trigger point pressure with cervical rotations, shoulder shrugs Grade III lateral glide mobilization to bil cervical spine Used contract relax technique to stretch above musculature and during lateral glides to improve end range flexibility Reviewed pillow positinoing in sidelying position. 10/07/21: Soft tissue mobilization to bil suboccipital region, cervical paraspinalis, middle and posterior scalenes bil, levator scapule and upper traps bil Passive stretching of above muscles in supine positions bil, utilized active release with trigger point pressure with cervical rotations, shoulder shrugs R sidelying passive scapular elevation and posterior depression to improve scapular mobility and passive stretch of cervical musculature during posterior depression Pt educated on using feather pillows and pulling the edge of the pillow into neck to provide support in supine and sidelying positions. Pt educated  on making sure shoulders are not on the pillow. Maintaining neutral curvature in c-spine  09/30/21:  Cervical rotation at beginning of session: Right=70 degrees and left=50  Supine manual techniques to cervical spine: suboccipital release 30 sec x 2, cervical traction  focusing on C1/C2 area 30 sec x 3, lateral glides on left for closing at C2-4 grade 3 3 bouts of 20 sec in neutral then at repeated at end range left cervical rotation and at end range left sidebend. Noted to be hypomobile. STM to left upper trap insertion x 2 min at occiput. Passive left upper trap stretch 30 sec x 3.  Discussed ergonomics at desk at home. Does not have standing desk there. Does report that monitor is right in front and she uses a lumbar support in her chair to help with maintaining upright posture.  Supine cervical retraction in to pillow x 10 with verbal cues and demonstration for form. Pt did well picking up on technique and did report feeling stretching at base of head. Seated Cervical Retraction x 6 reps with verbal cuing for form.   After treatment cervical rotation:  right=72 degrees and left=60 degrees with pt reporting it feeling better and able to move easier       09/23/21:    09/23/21 1804  Self-Care  Self-Care Other Self-Care Comments  Other Self-Care Comments  Following dry needling guided pt through 4 count diaphragmatic breathing and alternating tapping for activation of parasympathetic nervous system.  Therapeutic Activites   Therapeutic Activities Other Therapeutic Activities  Other Therapeutic Activities Provided pt trigger point dry needling educational handout and discussed purpose and use of dry needling, possible side effects, ways to mitigate side effects, treatment goals and screened pt for any precautions or contraindications: none identified.  Pt consented to use of dry needling today for treatment.     09/23/21 1807  Trigger Point Dry Needling  Consent Given? Yes  Education Handout Provided Yes  Muscles Treated Head and Neck Upper trapezius;Temporalis  Dry Needling Comments performed in supine; performed to R upper trapezius.  Following UT treatment pt reported increased HA in L temple; pt noted to have painful myofascial trigger point in L  temporalis.  Performed dry needling to L temporalis  Upper Trapezius Response Twitch reponse elicited;Palpable increased muscle length  Temporalis Response Twitch reponse elicited         PATIENT EDUCATION:  Education details: Ergonomics at desk at home and added cervical retraction exercise. Person educated: Patient Education method: Customer service manager and handout Education comprehension: verbalized understanding, return demonstration     HOME EXERCISE PROGRAM: Access Code [email or web address]   Exercises Seated Cervical Sidebending Stretch - 2-3 x daily - 7 x weekly - 3 reps - 15 sec hold Cat Cow - 2 x daily - 7 x weekly - 10 reps Seated Passive Cervical Retraction - 3 x daily - 7 x weekly - 1 sets - 10 reps    ASSESSMENT:   CLINICAL IMPRESSION:  Today's session focused on continued manual therapy to improve segemental joint mobility and soft tissue mobility around cervical/thoracic muscuatlure. Pt demo increased myofascial restrictions on R side of neck/shoulder and active trigger points in upper trap, levator scapulae, posteriro scalenes, and suboccipital region bil. Pt reported improved flexibility post session with mild soreness from soft tissue work.   REHAB POTENTIAL: Good   CLINICAL DECISION MAKING: Stable/uncomplicated   EVALUATION COMPLEXITY: Low     GOALS: Goals reviewed with patient? Yes   SHORT TERM GOALS:  STG Name Target Date Goal status  1 Patient will report <5 headaches per week to improve overall function Baseline: headaches every day, headaches are intermittent but reporting headaches over last 3 days 10/05/2021 On going  2 Patient will report compliance with HEP to self manage symptoms Baseline:  10/05/2021 Goal met  3 Patient will report at least 20 % decrease in OTC pain medication to improve conservative self management of her symptoms Baseline: 10/05/2021 On going    LONG TERM GOALS:    LTG Name Target Date Goal status  1 Patient will report at  least 5% improvement on NDI to improve overall function Baseline: NDI 26% (09/07/21) 11/02/2021 INITIAL  2 Pt will report at least 20 points improvement on HDI to improve overall function. Baseline:HDI = 38 (09/07/21) 11/02/2021 INITIAL  3 Patient will report <2 headaches per week to improve self management of headaches. Baseline:current frequency 7 days a week (09/07/21) 11/02/2021 INITIAL  4 Pt will report <3/10 pain neck pain to improve driving Baseline: 2-5/05 at worst (09/07/21) 11/02/2021 INITIAL    PLAN: PT FREQUENCY: 1-2x/week   PT DURATION: 8 weeks   PLANNED INTERVENTIONS: Therapeutic exercises, Therapeutic activity, Neuro Muscular re-education, Balance training, Gait training, Patient/Family education, Joint mobilization, Dry Needling, Electrical stimulation, Spinal mobilization, Cryotherapy, Moist heat, Traction, and Manual therapy   PLAN FOR NEXT SESSION:  Continue to focus on more postural exercises for pt to be able to do at home.   Markus Jarvis, PT  10/19/21    1:32 PM    [facility] [address] [place], [place], [address] Phone: [phone]   Fax:  [phone] MRN: [id number] Date of Birth: May 28, 1966 No data recorded                                              Name: Milynn Quirion MRN: [id number] Date of Birth: 09-30-1965

## 2021-10-21 ENCOUNTER — Other Ambulatory Visit: Payer: Self-pay

## 2021-10-21 ENCOUNTER — Ambulatory Visit: Payer: BC Managed Care – PPO

## 2021-10-21 DIAGNOSIS — M542 Cervicalgia: Secondary | ICD-10-CM

## 2021-10-21 DIAGNOSIS — G8929 Other chronic pain: Secondary | ICD-10-CM | POA: Diagnosis not present

## 2021-10-21 DIAGNOSIS — R519 Headache, unspecified: Secondary | ICD-10-CM

## 2021-10-21 NOTE — Therapy (Signed)
[facility] [address] [place], [place], [address] Phone: [phone]   Fax:  [phone]   OUTPATIENT PHYSICAL THERAPY TREATMENT NOTE   Patient Name: Amber Richmond MRN: [id number] DOB:December 17, 1965, 56 y.o., female Today's Date: 10/21/2021  PCP: Janith Lima, MD REFERRING PROVIDER: Janith Lima, MD   PT End of Session - 10/21/21 1255     Visit Number 7    Number of Visits 17    Date for PT Re-Evaluation 11/02/21    Authorization Type BCBS 2023 - PT/OT/ST 90 visit limit combined; hard max    PT Start Time 1230    PT Stop Time 1315    PT Time Calculation (min) 45 min    Activity Tolerance Patient tolerated treatment well    Behavior During Therapy WFL for tasks assessed/performed               Past Medical History:  Diagnosis Date   Migraines    Past Surgical History:  Procedure Laterality Date   BREAST ENHANCEMENT SURGERY     GALLBLADDER SURGERY     tummy tuck     Patient Active Problem List   Diagnosis Date Noted   Migraine without aura and without status migrainosus, not intractable 09/12/2021   Cervical spinal stenosis 05/16/2021   Routine general medical examination at a health care facility 04/06/2021   New daily persistent headache 04/06/2021   Thrombocytopenia ([place]) 04/06/2021    REFERRING DIAG: G43.709 (ICD-10-CM) - Chronic migraine w/o aura w/o status migrainosus, not intractable M54.2,G89.29 (ICD-10-CM) - Chronic neck pain M54.2 (ICD-10-CM) - Myofascial neck pain M48.02 (ICD-10-CM) - Cervical stenosis of spinal canal   ONSET DATE: 08/15/21   THERAPY DIAG:  Cervicalgia  Chronic intractable headache, unspecified headache type  Neck pain  PERTINENT HISTORY: PERTINENT HISTORY:  PMHx migraines, cervical spinal stenosis.   PRECAUTIONS: none  SUBJECTIVE: She has been sick for past couple of weeks and very congested. She has been having headaches everyday but they are  different. She hasn't had a mirgraine as she hasn't had need to take mirgaine meds but just taking OTC pain meds for headaches. Using pillows properly has helped her manage neck pain at night and she is sleeping better as a result.  PAIN:  Are you having pain?  Yes, left posterior neck at base of head.  VAS scale: 2 Pain location:  Pain orientation:  PAIN TYPE:  Pain description: "I'm aware of it", constant Aggravating factors:  Relieving factors:    OBJECTIVE:  TODAY'S TREATMENT:  10/21/21: Soft tissue mobilization to bil suboccipital region, cervical paraspinalis, middle and posterior scalenes bil, levator scapule and upper traps bil Passive stretching of above muscles in supine positions bil, utilized active release with trigger point pressure with cervical rotations, shoulder shrugs Passive, active and manually resisted scapular elevation and depression and scapular protraction and retraction in sidelying positions: 10' Pt educated on how to use thera cane to work on trigger points in her shoulders and neck. Given resources to purchase it from [employer].  //10/19/21: Soft tissue mobilization to bil suboccipital region, cervical paraspinalis, middle and posterior scalenes bil, levator scapule and upper traps bil Passive stretching of above muscles in supine positions bil, utilized active release with trigger point pressure with cervical rotations, shoulder shrugs Grade III lateral glide mobilization to bil cervical spine Used contract relax technique to stretch above musculature and during lateral glides to improve end range flexibility Reviewed pillow positinoing in sidelying position. 10/07/21: Soft tissue  mobilization to bil suboccipital region, cervical paraspinalis, middle and posterior scalenes bil, levator scapule and upper traps bil Passive stretching of above muscles in supine positions bil, utilized active release with trigger point pressure with cervical rotations, shoulder  shrugs R sidelying passive scapular elevation and posterior depression to improve scapular mobility and passive stretch of cervical musculature during posterior depression Pt educated on using feather pillows and pulling the edge of the pillow into neck to provide support in supine and sidelying positions. Pt educated on making sure shoulders are not on the pillow. Maintaining neutral curvature in c-spine  09/30/21:  Cervical rotation at beginning of session: Right=70 degrees and left=50  Supine manual techniques to cervical spine: suboccipital release 30 sec x 2, cervical traction focusing on C1/C2 area 30 sec x 3, lateral glides on left for closing at C2-4 grade 3 3 bouts of 20 sec in neutral then at repeated at end range left cervical rotation and at end range left sidebend. Noted to be hypomobile. STM to left upper trap insertion x 2 min at occiput. Passive left upper trap stretch 30 sec x 3.  Discussed ergonomics at desk at home. Does not have standing desk there. Does report that monitor is right in front and she uses a lumbar support in her chair to help with maintaining upright posture.  Supine cervical retraction in to pillow x 10 with verbal cues and demonstration for form. Pt did well picking up on technique and did report feeling stretching at base of head. Seated Cervical Retraction x 6 reps with verbal cuing for form.   After treatment cervical rotation:  right=72 degrees and left=60 degrees with pt reporting it feeling better and able to move easier       09/23/21:    09/23/21 1804  Self-Care  Self-Care Other Self-Care Comments  Other Self-Care Comments  Following dry needling guided pt through 4 count diaphragmatic breathing and alternating tapping for activation of parasympathetic nervous system.  Therapeutic Activites   Therapeutic Activities Other Therapeutic Activities  Other Therapeutic Activities Provided pt trigger point dry needling educational handout and  discussed purpose and use of dry needling, possible side effects, ways to mitigate side effects, treatment goals and screened pt for any precautions or contraindications: none identified.  Pt consented to use of dry needling today for treatment.     09/23/21 1807  Trigger Point Dry Needling  Consent Given? Yes  Education Handout Provided Yes  Muscles Treated Head and Neck Upper trapezius;Temporalis  Dry Needling Comments performed in supine; performed to R upper trapezius.  Following UT treatment pt reported increased HA in L temple; pt noted to have painful myofascial trigger point in L temporalis.  Performed dry needling to L temporalis  Upper Trapezius Response Twitch reponse elicited;Palpable increased muscle length  Temporalis Response Twitch reponse elicited           PATIENT EDUCATION:  Education details: Ergonomics at desk at home and added cervical retraction exercise. Person educated: Patient Education method: Customer service manager and handout Education comprehension: verbalized understanding, return demonstration     HOME EXERCISE PROGRAM: Access Code [email or web address]   Exercises Seated Cervical Sidebending Stretch - 2-3 x daily - 7 x weekly - 3 reps - 15 sec hold Cat Cow - 2 x daily - 7 x weekly - 10 reps Seated Passive Cervical Retraction - 3 x daily - 7 x weekly - 1 sets - 10 reps    ASSESSMENT:   CLINICAL IMPRESSION:  Pt reported improved  flexibility after manual therapy. Pt was educated on use of theracane for self management of soft tissue restrictions in neck.   REHAB POTENTIAL: Good   CLINICAL DECISION MAKING: Stable/uncomplicated   EVALUATION COMPLEXITY: Low     GOALS: Goals reviewed with patient? Yes   SHORT TERM GOALS:   STG Name Target Date Goal status  1 Patient will report <5 headaches per week to improve overall function Baseline: headaches every day, headaches are intermittent but reporting headaches over last 3 days 10/05/2021 On going   2 Patient will report compliance with HEP to self manage symptoms Baseline:  10/05/2021 Goal met  3 Patient will report at least 20 % decrease in OTC pain medication to improve conservative self management of her symptoms Baseline: 10/05/2021 On going    LONG TERM GOALS:    LTG Name Target Date Goal status  1 Patient will report at least 5% improvement on NDI to improve overall function Baseline: NDI 26% (09/07/21) 11/02/2021 INITIAL  2 Pt will report at least 20 points improvement on HDI to improve overall function. Baseline:HDI = 38 (09/07/21) 11/02/2021 INITIAL  3 Patient will report <2 headaches per week to improve self management of headaches. Baseline:current frequency 7 days a week (09/07/21) 11/02/2021 INITIAL  4 Pt will report <3/10 pain neck pain to improve driving Baseline: 4-6/65 at worst (09/07/21) 11/02/2021 INITIAL    PLAN: PT FREQUENCY: 1-2x/week   PT DURATION: 8 weeks   PLANNED INTERVENTIONS: Therapeutic exercises, Therapeutic activity, Neuro Muscular re-education, Balance training, Gait training, Patient/Family education, Joint mobilization, Dry Needling, Electrical stimulation, Spinal mobilization, Cryotherapy, Moist heat, Traction, and Manual therapy   PLAN FOR NEXT SESSION:  Continue to focus on more postural exercises for pt to be able to do at home.   Markus Jarvis, PT  10/21/21    1:17 PM    [facility] [address] [place], [place], [address] Phone: [phone]   Fax:  [phone] MRN: [id number] Date of Birth: 04-27-1966 No data recorded                                              Name: Amber Richmond MRN: [id number] Date of Birth: September 06, 1965

## 2021-10-26 ENCOUNTER — Ambulatory Visit: Payer: BC Managed Care – PPO

## 2021-10-28 ENCOUNTER — Ambulatory Visit: Payer: BC Managed Care – PPO

## 2021-11-09 ENCOUNTER — Ambulatory Visit: Payer: BC Managed Care – PPO

## 2021-11-09 ENCOUNTER — Other Ambulatory Visit: Payer: Self-pay | Admitting: Neurology

## 2021-11-09 DIAGNOSIS — G43009 Migraine without aura, not intractable, without status migrainosus: Secondary | ICD-10-CM

## 2021-11-11 ENCOUNTER — Ambulatory Visit: Payer: BC Managed Care – PPO

## 2021-11-14 ENCOUNTER — Telehealth (INDEPENDENT_AMBULATORY_CARE_PROVIDER_SITE_OTHER): Payer: Self-pay | Admitting: Neurology

## 2021-11-14 DIAGNOSIS — Z91199 Patient's noncompliance with other medical treatment and regimen due to unspecified reason: Secondary | ICD-10-CM

## 2021-11-14 NOTE — Progress Notes (Signed)
?  Amber Richmond NO-SHOWED follow up appointment.  ? ?

## 2021-11-16 ENCOUNTER — Encounter: Payer: Self-pay | Admitting: Neurology

## 2022-01-18 ENCOUNTER — Encounter: Payer: Self-pay | Admitting: Neurology

## 2022-01-18 ENCOUNTER — Ambulatory Visit: Payer: BC Managed Care – PPO | Admitting: Neurology

## 2022-01-18 VITALS — BP 102/64 | HR 63 | Ht 62.0 in | Wt 126.6 lb

## 2022-01-18 DIAGNOSIS — M542 Cervicalgia: Secondary | ICD-10-CM | POA: Diagnosis not present

## 2022-01-18 DIAGNOSIS — G43009 Migraine without aura, not intractable, without status migrainosus: Secondary | ICD-10-CM | POA: Diagnosis not present

## 2022-01-18 DIAGNOSIS — G8929 Other chronic pain: Secondary | ICD-10-CM | POA: Diagnosis not present

## 2022-01-18 DIAGNOSIS — M5030 Other cervical disc degeneration, unspecified cervical region: Secondary | ICD-10-CM | POA: Diagnosis not present

## 2022-01-18 MED ORDER — ZOLMITRIPTAN 5 MG PO TABS: 5.0000 mg | ORAL_TABLET | ORAL | 11 refills | Status: DC | PRN

## 2022-01-18 MED ORDER — QULIPTA 60 MG PO TABS: 1.0000 | ORAL_TABLET | Freq: Every day | ORAL | 3 refills | Status: DC

## 2022-01-18 NOTE — Patient Instructions (Addendum)
Dr. Dorene Ar: referral for consult for injections, [facility] ?Continue current medications ?Recheck MRI cervical spine yearly ?Returnin one year but if other symptoms see below call sooner ? ?Meds ordered this encounter  ?Medications  ? Atogepant (QULIPTA) 60 MG TABS  ?  Sig: Take 1 tablet by mouth daily.  ?  Dispense:  90 tablet  ?  Refill:  3  ? zolmitriptan (ZOMIG) 5 MG tablet  ?  Sig: Take 1 tablet (5 mg total) by mouth as needed for migraine. May repeat in 2 hours. Max twice a day.  ?  Dispense:  10 tablet  ?  Refill:  11  ? ?Cervical Radiculopathy ? ?Cervical radiculopathy happens when a nerve in the neck (a cervical nerve) is pinched or bruised. This condition can happen because of an injury to the cervical spine (vertebrae) in the neck, or as part of the normal aging process. Pressure on the cervical nerves can cause pain or numbness that travels from the neck all the way down to the arm and fingers. This condition usually gets better with rest. Treatment may be needed if the condition does not improve. ?What are the causes? ?This condition may be caused by: ?A neck injury. ?A bulging (herniated) disk. ?Muscle spasms. ?Muscle tightness in the neck due to overuse. ?Arthritis. ?Breakdown or degeneration in the bones and joints of the spine (spondylosis) due to aging. ?Bone spurs that may develop near the cervical nerves. ?What are the signs or symptoms? ?Symptoms of this condition include: ?Pain. The pain may travel from the neck to the arm and hand. The pain can be severe or irritating. It may get worse when you move your neck. ?Numbness or tingling in your arm or hand. ?Weakness in the affected arm and hand, in severe cases. ?How is this diagnosed? ?This condition may be diagnosed based on your symptoms, your medical history, and a physical exam. You may also have tests, including: ?X-rays. ?CT scan. ?MRI. ?Electromyogram (EMG). ?Nerve conduction tests. ?How is this treated? ?In many cases,  treatment is not needed for this condition. With rest, the condition usually gets better over time. If treatment is needed, options may include: ?Wearing a soft neck collar (cervical collar) for short periods of time. ?Doing physical therapy to strengthen your neck muscles. ?Taking medicines. These may include NSAIDs, such as ibuprofen, or oral corticosteroids. ?Having spinal injections, in severe cases. ?Having surgery. This may be needed if other treatments do not help. Different types of surgery may be done depending on the cause of this condition. ?Follow these instructions at home: ?If you have a cervical collar: ?Wear it as told by your health care provider. Remove it only as told by your health care provider. ?Ask your health care provider if you can remove the cervical collar for cleaning and bathing. If you are allowed to remove the collar for cleaning or bathing: ?Follow instructions from your health care provider about how to remove the collar safely. ?Clean the collar by wiping it with mild soap and water and drying it completely. ?Take out any removable pads in the collar every 1-2 days, and wash them by hand with soap and water. Let them air-dry completely before you put them back in the collar. ?Check your skin under the collar for irritation or sores. If you see any, tell your health care provider. ?Managing pain ? ?  ? ?Take over-the-counter and prescription medicines only as told by your health care provider. ?If directed, put ice on the affected  area. To do this: ?If you have a soft neck collar, remove it as told by your health care provider. ?Put ice in a plastic bag. ?Place a towel between your skin and the bag. ?Leave the ice on for 20 minutes, 2-3 times a day. ?Remove the ice if your skin turns bright red. This is very important. If you cannot feel pain, heat, or cold, you have a greater risk of damage to the area. ?If applying ice does not help, you can try using heat. Use the heat source  that your health care provider recommends, such as a moist heat pack or a heating pad. ?Place a towel between your skin and the heat source. ?Leave the heat on for 20-30 minutes. ?Remove the heat if your skin turns bright red. This is especially important if you are unable to feel pain, heat, or cold. You have a greater risk of getting burned. ?Try a gentle neck and shoulder massage to help relieve symptoms. ?Activity ?Rest as needed. ?Return to your normal activities as told by your health care provider. Ask your health care provider what activities are safe for you. ?Do stretching and strengthening exercises as told by your health care provider or your physical therapist. ?You may have to avoid lifting. Ask your health care provider how much you can safely lift. ?General instructions ?Use a flat pillow when you sleep. ?Do not drive while wearing a cervical collar. If you do not have a cervical collar, ask your health care provider if it is safe to drive while your neck heals. ?Ask your health care provider if the medicine prescribed to you requires you to avoid driving or using machinery. ?Do not use any products that contain nicotine or tobacco. These products include cigarettes, chewing tobacco, and vaping devices, such as e-cigarettes. If you need help quitting, ask your health care provider. ?Keep all follow-up visits. This is important. ?Contact a health care provider if: ?Your condition does not improve with treatment. ?Get help right away if: ?Your pain gets much worse and is not controlled with medicines. ?You have weakness or numbness in your hand, arm, face, or leg. ?You have a high fever. ?You have a stiff, rigid neck. ?You lose control of your bowels or your bladder (have incontinence). ?You have trouble with walking, balance, or speaking. ?Summary ?Cervical radiculopathy happens when a nerve in the neck is pinched or bruised. ?A nerve can get pinched from a bulging disk, arthritis, muscle spasms, or  an injury to the neck. ?Symptoms include pain, tingling, or numbness radiating from the neck to the arm or hand. Weakness can also occur in severe cases. ?Treatment may include rest, wearing a cervical collar, and physical therapy. Medicines may be prescribed to help with pain. In severe cases, injections or surgery may be needed. ?This information is not intended to replace advice given to you by your health care provider. Make sure you discuss any questions you have with your health care provider. ?Document Revised: 02/24/2021 Document Reviewed: 02/24/2021 ?Elsevier Patient Education ? 2023 [employer] Inc[employer] ? ? ? ? ?

## 2022-01-18 NOTE — Progress Notes (Signed)
[facility]    Provider:  Dr Lucia Gaskins Requesting Provider: [name], MD Primary Care Provider:  [name], MD  CC:  headaches and neck pain  01/18/2022: Amber Richmond doing great. She doesn't even need ubrelvy or zomig. Her neck still hurts, she did fry needling, she went to physical therapy, still have a lot of tightness in the neck, no shooting pain down the arms.  No weakness or numbness. We ordered an mri cervical spine, reviewed below.   MRI cervical spine: IMPRESSION: personally reviewed and agree with the following 1. Multilevel cervical spondylosis with resultant moderate diffuse spinal stenosis at C3-4 through C6-7. 2. Multifactorial degenerative changes with resultant multilevel foraminal narrowing as above. Notable findings include moderate left C4 foraminal stenosis, moderate bilateral C5 foraminal narrowing, moderate right worse than left C6 foraminal stenosis, with severe right and moderate left C7 foraminal narrowing.  Addendum 09/12/2021: She has between 4 and 14 migraine+headache days total months for the last 6 months. Migraines are  pulsating/ pounding/ throbbing, unilateral, nausea, phono/phonophobia, no aura, no medication overuse. Can qualify for episodic migraines and qulipta as she is doing very well on it. Also try Ubrelvy.  In September send myself an email for repeat c-spine  HPI 08/10/2021:  Amber Richmond is a 56 y.o. female here as requested by [name], MD for headaches. PMHx migraines, cervical spinal stenosis. Started as menstrual migraines after the birth of her daughter, she dealt with them, they fluctuated, she has seen multiple neurologists, she had to go to the hospital, she saw a prominent neurologist in [place]. They did not go away after menopause. She is post menopausal now for 3 years. She has a history waking with the migraines in the morning, more consistently in the morning than anything. She has daily neck pain.  In the past  she always had them on the left side, pulsating/pounding/throbbing, photo/phonophobia, nausea, has vomited in the past, movement would make it worse. When she wakes up she has severe neck pain. It hursts to put her neck pain back. She can feel the pain in her neck and thet is where the pain is coming from radiating up the back of the head. (Points to the emergence of the occipital nerves). She has numbness in her fingers. She had botox. She had cervical muscle pain and a lot of joint issues on the aimovig. Both daughters on migraines. No other focal neurologic deficits, associated symptoms, inciting events or modifiable factors.  She was on multiple preventatives: from a thorough review of records: aimovig (helped but had side effects), Emgality, botox, zomig, imitrex, compazine, qulipta, emgality, topamax, elavil, pamelor, depakote, inderal, excedrin with no benefit.  She tried Effexor did not like how it made her feel.  Naprosyn, Aleve, ibuprofen, Excedrin did not help, Migranal did not help.  Imitrex may help the best for severe migraines but Zomig works better for moderate migraines.  Uses Phenergan for nausea.  Reviewed notes, labs and imaging from outside physicians, which showed:  I reviewed notes from Dr. Johnsie Cancel, this is a 56 year old female with a history of migraines and neck pain, progressive over multiple years, improved with morning stretches of her neck but worsened throughout the day with normal activities, she has been taking daily NSAIDs, headaches are occipital base, fairly well time to the neck pain, no radicular pain at this time, no symptoms consistent with myelopathy and review of symptoms but does have some vertigo.    I also reviewed notes from  neurology Dr. Baldo Daub she was seen there with chronic migraine without aura in April 2022, tried Zomig, Aimovig worked well for the first month then she had side effects, now she is back up to frequent migraines being off the  Aimovig, 20 migraine days per month, the patient has had 15 or more headache days per month of which 8 or more migraines lasting for longer than 4 hours if untreated for years.  Emgality did not seem to help.  She has eliminated sugars from her diet, she tried gluten-free, magnesium supplements, they are pulsating and pounding bitemporal sometimes in the occipital regions associated with photophobia and phonophobia nausea without vomiting.  Per his notes, the patient had a normal MRI of the head with and without contrast in May 2021.  She also reported neck pain which may be triggering her migraines.  No radicular symptoms.  They discussed Ajovy.  Other meds discussed atogepant and she was educated on medication overuse headaches.  They also discussed myofascial pain syndrome, PT, trigger point injections.  MRI cervical spine: 05/16/2021 IMPRESSION: 1. Multilevel cervical spondylosis with resultant moderate diffuse spinal stenosis at C3-4 through C6-7. 2. Multifactorial degenerative changes with resultant multilevel foraminal narrowing as above. Notable findings include moderate left C4 foraminal stenosis, moderate bilateral C5 foraminal narrowing, moderate right worse than left C6 foraminal stenosis, with severe right and moderate left C7 foraminal narrowing.  MRA head:  1. No intracranial large vessel occlusion or proximal high-grade arterial stenosis. 2. 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  05/15/2021: MRI brain/MRA head: IMPRESSION: MRI brain:   Unremarkable non-contrast MRI appearance of the brain. No evidence of acute intracranial abnormality.   MRA head:   1. No intracranial large vessel occlusion or proximal high-grade arterial stenosis. 2. 2 mm anteriorly projecting vascular protrusion arising from the cavernous right internal carotid artery, likely reflecting an aneurysm  aneurysm.  Personally reviewed images and agree, also reviewed images with patient today  Review of Systems: Patient complains of symptoms per HPI as well as the following symptoms neck pain. Pertinent negatives and positives per HPI. All others negative.   Social History   Socioeconomic History   Marital status: Unknown    Spouse name: Not on file   Number of children: Not on file   Years of education: Not on file   Highest education level: Not on file  Occupational History   Not on file  Tobacco Use   Smoking status: Never   Smokeless tobacco: Never  Vaping Use   Vaping Use: Never used  Substance and Sexual Activity   Alcohol use: Never   Drug use: Never   Sexual activity: Not Currently  Other Topics Concern   Not on file  Social History Narrative   Caffeine- decaff one cup daily.  Education HS,.  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   Social Determinants of Health   Financial Resource Strain: Not on file  Food Insecurity: Not on file  Transportation Needs: Not on file  Physical Activity: Not on file  Stress: Not on file  Social Connections: Not on file  Intimate Partner Violence: Not on file    Family History  Problem Relation Age of Onset   Diabetes Mother    COPD Mother    Hypertension Father    Cancer Father        skin, leukemia   Heart disease Father    Diabetes Father  Cataracts Father    Diabetes Maternal Grandmother    Migraines Neg Hx     Past Medical History:  Diagnosis Date   Migraines     Patient Active Problem List   Diagnosis Date Noted   Degenerative cervical disc 01/23/2022   Migraine without aura and without status migrainosus, not intractable 09/12/2021   Cervical spinal stenosis 05/16/2021   Routine general medical examination at a health care facility 04/06/2021   New daily persistent headache 04/06/2021   Thrombocytopenia (HCC) 04/06/2021   Chronic neck pain 04/06/2021    Past Surgical History:  Procedure  Laterality Date   BREAST ENHANCEMENT SURGERY     GALLBLADDER SURGERY     tummy tuck      Current Outpatient Medications  Medication Sig Dispense Refill   Misc Natural Products (GLUCOSAMINE CHOND CMP TRIPLE) TABS Take 1 tablet by mouth daily.     prochlorperazine (COMPAZINE) 10 MG tablet Take 1 tablet (10 mg total) by mouth every 12 (twelve) hours as needed for nausea or vomiting. 30 tablet 11   Ubrogepant (UBRELVY) 100 MG TABS Take 100 mg by mouth every 2 (two) hours as needed. Maximum 200mg  a day. 16 tablet 11   Atogepant (QULIPTA) 60 MG TABS Take 1 tablet by mouth daily. 90 tablet 3   zolmitriptan (ZOMIG) 5 MG tablet Take 1 tablet (5 mg total) by mouth as needed for migraine. May repeat in 2 hours. Max twice a day. 10 tablet 11   No current facility-administered medications for this visit.    Allergies as of 01/18/2022   (No Known Allergies)    Vitals: BP 102/64   Pulse 63   Ht 5\' 2"  (1.575 m)   Wt 126 lb 9.6 oz (57.4 kg)   BMI 23.16 kg/m  Last Weight:  Wt Readings from Last 1 Encounters:  01/18/22 126 lb 9.6 oz (57.4 kg)   Last Height:   Ht Readings from Last 1 Encounters:  01/18/22 5\' 2"  (1.575 m)   Exam: NAD, pleasant                  Speech:    Speech is normal; fluent and spontaneous with normal comprehension.  Cognition:    The patient is oriented to person, place, and time;     recent and remote memory intact;     language fluent;    Cranial Nerves:    The pupils are equal, round, and reactive to light.Trigeminal sensation is intact and the muscles of mastication are normal. The face is symmetric. The palate elevates in the midline. Hearing intact. Voice is normal. Shoulder shrug is normal. The tongue has normal motion without fasciculations.   Coordination:  No dysmetria  Motor Observation:    No asymmetry, no atrophy, and no involuntary movements noted. Tone:    Normal muscle tone.     Strength:    Strength is V/V in the upper and lower limbs.       Sensation: intact to LT  Assessment/Plan:  F/u for chronic neck pain and migraines.  She has seen prior neurologists  about her migraines and failed multiple medications, even CGRP injectables and Botox; She was on multiple preventatives: from a thorough review of records: aimovig (helped but had side effects), Emgality, botox, zomig, imitrex, compazine, qulipta, emgality, topamax, elavil, pamelor, depakote, inderal, excedrin with no benefit.  She tried Effexor did not like how it made her feel.  Naprosyn, Aleve, ibuprofen, Excedrin did not help, Migranal did not help.  Imitrex may help the best for severe migraines but Zomig works better for moderate migraines.  Uses Phenergan or compazine for nausea.  Migraines: Doing great on qulipta and zomig, hasn't had to even use nurtec or ubrevy  Chronic neck pain and degernative changes(see below): Saw Dr. Maurice Small. Will send to Dr. Lorrine Kin for any interventional options as below, [facility] Continue current medications Recheck MRI cervical spine yearly Return in one year but if other symptoms see below call sooner  - Chronic neck pain. Strength is 5/5. Hoffman's neg. No myelopathic signs. Reflexes normal. Toes downgoing. But she does have multilevel cervical spondylosis with resultant moderate diffuse spinal stenosis at C3-4 through C6-7 (worst at c5-c6 and c3-c4 measuring approx 7mm) with multi-level foraminal stenosis.  She does not have radicular symptoms however she does have midline cervical pain, daily neck pain, neck pain is a trigger, and neck pain radiates from the midline approximately C5-C6 up the neck and from the emergence of the occipital nerves and radiating up the back of the head.  Sent to PT. Saw Dr. Maurice Small. Will send to Dr. Lorrine Kin for any interventional options as below  Cervical injections: She does not have radicular symptoms but she has tremendous midline neck pain(and cervical myofascial neck pain) maybe something can  be done interventionally? maybe medial branch blocks for occipital neuralgia or possibly facet injections? Her neck isa dvanced  for her age; she has multilevel cervical spondylosis with resultant moderate diffuse spinal stenosis at C3-4 through C6-7 (worst at c5-c6 and c3-c4 measuring approx 7mm) with multi-level foraminal stenosis. Referral to Dr. Dorene Ar.   PT referred in the past: Physical Therapy: Cervical myofascial pain, cervical stenosis with chronic neck pain,  contributing to migraines and cervicalgia. Please evaluate and treat including dry needling, stretching, strengthening, manual therapy/massage, heating, TENS unit, exercising for scapular stabilization, pectoral stretching and rhomboid strengthening as clinically warranted as well as any other modality as recommended by evaluation.   Addendum 09/12/2021: She has between 4 and 14 migraine+headache days total months for the last 6 months. Migraines are  pulsating/ pounding/ throbbing, unilateral, nausea, phono/phonophobia, no aura, no medication overuse. Can qualify for episodic migraines and qulipta as she is doing very well on it. Also try Ubrelvy.  Orders Placed This Encounter  Procedures   Ambulatory referral to Pain Clinic    Meds ordered this encounter  Medications   Atogepant (QULIPTA) 60 MG TABS    Sig: Take 1 tablet by mouth daily.    Dispense:  90 tablet    Refill:  3   zolmitriptan (ZOMIG) 5 MG tablet    Sig: Take 1 tablet (5 mg total) by mouth as needed for migraine. May repeat in 2 hours. Max twice a day.    Dispense:  10 tablet    Refill:  11    Cc: [name], MD,  [name], MD  Naomie Dean, MD  [facility] [address] [place], [place] [address]  Phone [phone] Fax [phone]  I spent over 45 minutes of face-to-face and non-face-to-face time with patient on the  1. Degenerative cervical disc   2. Migraine without aura and without status migrainosus,  not intractable   3. Chronic neck pain     diagnosis.  This included previsit chart review, lab review, study review, order entry, electronic health record documentation, patient education on the different diagnostic and therapeutic options, counseling and coordination of care, risks and benefits of management, compliance, or risk factor reduction

## 2022-01-23 ENCOUNTER — Telehealth: Payer: Self-pay | Admitting: Neurology

## 2022-01-23 DIAGNOSIS — M5030 Other cervical disc degeneration, unspecified cervical region: Secondary | ICD-10-CM | POA: Insufficient documentation

## 2022-01-23 NOTE — Telephone Encounter (Signed)
Referral for Pain Management sent to [facility] [phone]. 

## 2022-02-06 DIAGNOSIS — Z6822 Body mass index (BMI) 22.0-22.9, adult: Secondary | ICD-10-CM | POA: Diagnosis not present

## 2022-02-06 DIAGNOSIS — N760 Acute vaginitis: Secondary | ICD-10-CM | POA: Diagnosis not present

## 2022-02-06 DIAGNOSIS — Z0142 Encounter for cervical smear to confirm findings of recent normal smear following initial abnormal smear: Secondary | ICD-10-CM | POA: Diagnosis not present

## 2022-02-06 DIAGNOSIS — N898 Other specified noninflammatory disorders of vagina: Secondary | ICD-10-CM | POA: Diagnosis not present

## 2022-02-06 DIAGNOSIS — Z01419 Encounter for gynecological examination (general) (routine) without abnormal findings: Secondary | ICD-10-CM | POA: Diagnosis not present

## 2022-02-06 DIAGNOSIS — Z1231 Encounter for screening mammogram for malignant neoplasm of breast: Secondary | ICD-10-CM | POA: Diagnosis not present

## 2022-02-17 DIAGNOSIS — R102 Pelvic and perineal pain: Secondary | ICD-10-CM | POA: Diagnosis not present

## 2022-02-17 DIAGNOSIS — N898 Other specified noninflammatory disorders of vagina: Secondary | ICD-10-CM | POA: Diagnosis not present

## 2022-02-20 ENCOUNTER — Telehealth: Payer: Self-pay | Admitting: Neurology

## 2022-02-20 NOTE — Telephone Encounter (Signed)
Intiated CMM key [id number] for Qulipta.

## 2022-02-20 NOTE — Telephone Encounter (Signed)
I called # left, [facility]  will try to do PA.

## 2022-02-20 NOTE — Telephone Encounter (Signed)
Pt called stating that when she tried to refill her Atogepant (QULIPTA) 60 MG TABS the pharmacy informed her that she is needing to get another PA for it. Pt states that she called her insurance and they informed her that since she is completely out of her medication, the provider can call the provider line only [phone] to give a Verbal approval. Please advise.

## 2022-02-21 NOTE — Telephone Encounter (Addendum)
Approval from [facility] 02-21-22 thru 02-22-23.   PA [phone] JV

## 2022-02-24 ENCOUNTER — Encounter: Payer: Self-pay | Admitting: Internal Medicine

## 2022-02-27 ENCOUNTER — Other Ambulatory Visit: Payer: Self-pay | Admitting: Internal Medicine

## 2022-03-02 ENCOUNTER — Other Ambulatory Visit: Payer: Self-pay | Admitting: Internal Medicine

## 2022-03-02 DIAGNOSIS — Z1211 Encounter for screening for malignant neoplasm of colon: Secondary | ICD-10-CM

## 2022-03-08 DIAGNOSIS — Z8669 Personal history of other diseases of the nervous system and sense organs: Secondary | ICD-10-CM | POA: Diagnosis not present

## 2022-03-08 DIAGNOSIS — M47812 Spondylosis without myelopathy or radiculopathy, cervical region: Secondary | ICD-10-CM | POA: Diagnosis not present

## 2022-03-15 DIAGNOSIS — N950 Postmenopausal bleeding: Secondary | ICD-10-CM | POA: Diagnosis not present

## 2022-03-15 LAB — HM PAP SMEAR

## 2022-03-17 DIAGNOSIS — Z1211 Encounter for screening for malignant neoplasm of colon: Secondary | ICD-10-CM | POA: Diagnosis not present

## 2022-03-24 LAB — COLOGUARD: COLOGUARD: NEGATIVE

## 2022-04-07 DIAGNOSIS — M47812 Spondylosis without myelopathy or radiculopathy, cervical region: Secondary | ICD-10-CM | POA: Diagnosis not present

## 2022-04-26 IMAGING — MR MR MRA HEAD W/O CM
1 series · 22 of 48 positions shown · non-contrast
Comparison: Same-day MRI cervical spine 05/15/2021

CLINICAL DATA: Headache, new or worsening. Ataxia, new daily
persistent headache, new or worsening.

EXAM:
MRI HEAD WITHOUT CONTRAST
MRA HEAD WITHOUT CONTRAST
TECHNIQUE: Multiplanar, multi-echo pulse sequences of the brain and surrounding
structures were acquired without intravenous contrast. Angiographic
images of the Circle of Willis were acquired using MRA technique
without intravenous contrast.

[Series 3: [id number] · axial · 0.7mm · 0.35mm/px · z∈[-109,-15]mm · 22 of 143 slices shown]
[im 1/143]
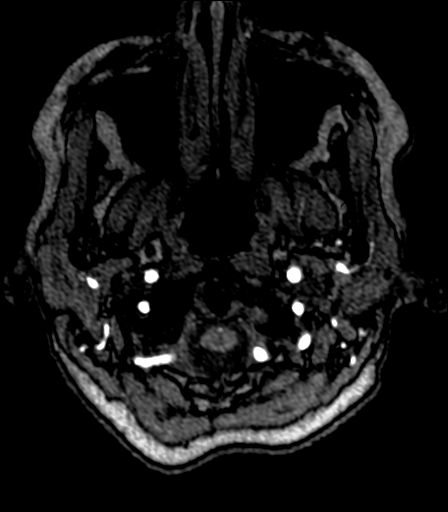
[im 4/143]
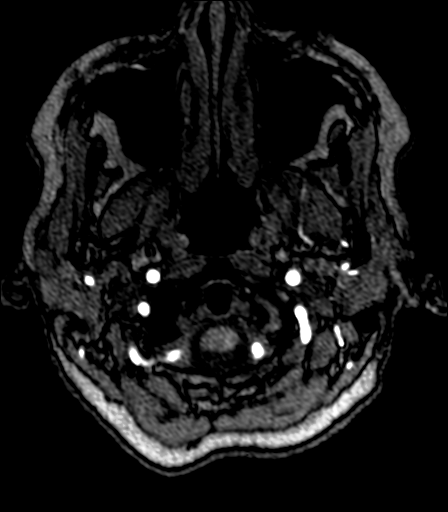
[im 7/143]
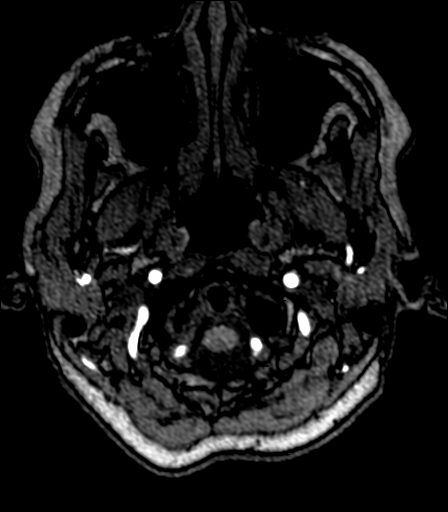
[im 10/143]
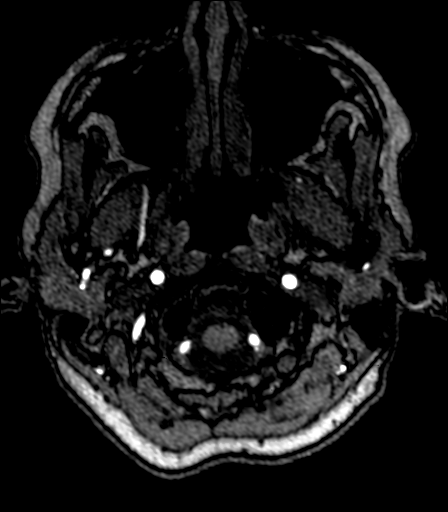
[im 13/143]
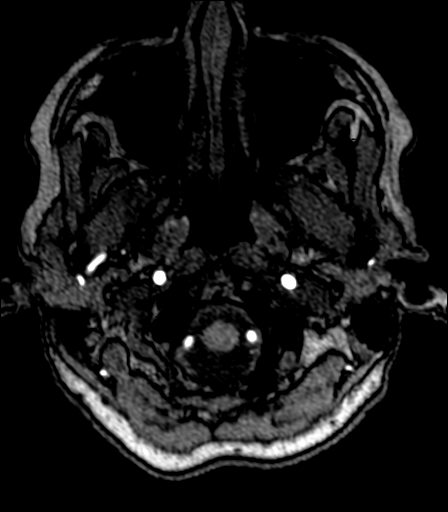
[im 16/143]
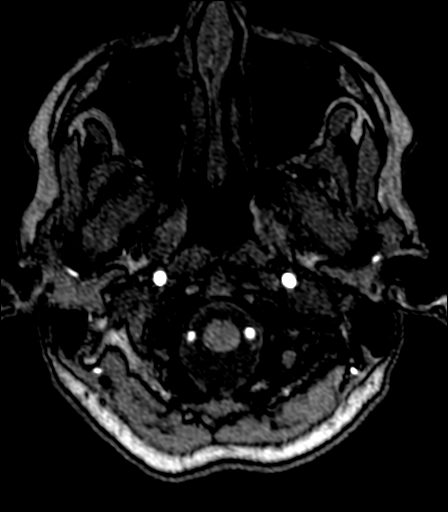
[im 19/143]
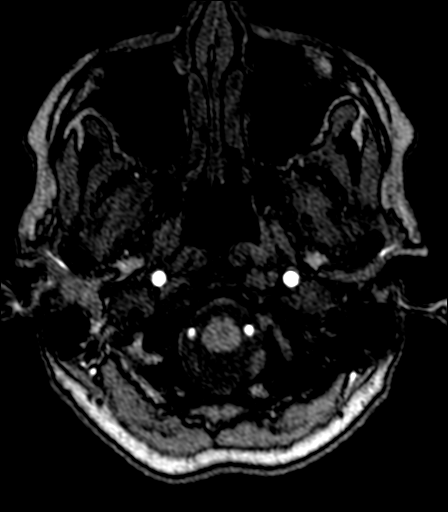
[im 22/143]
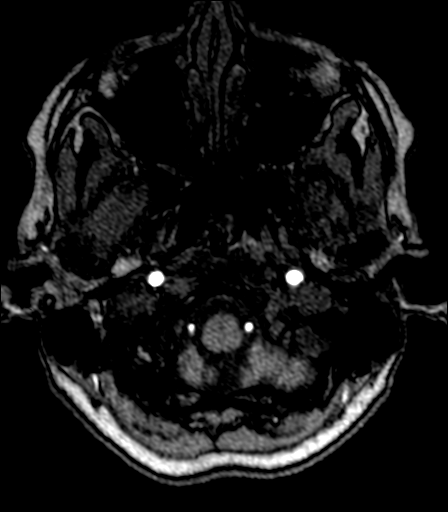
[im 25/143]
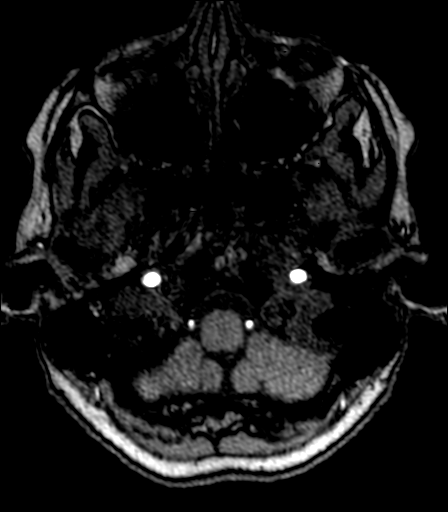
[im 28/143]
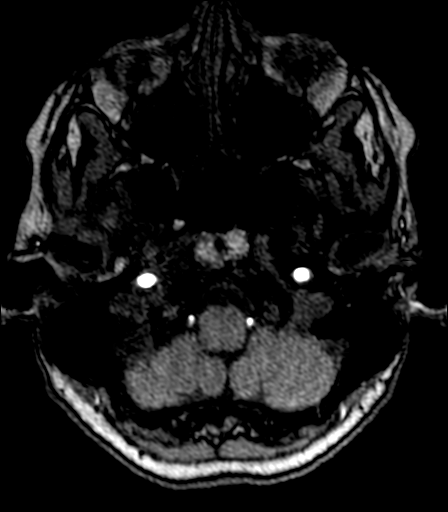
[im 31/143]
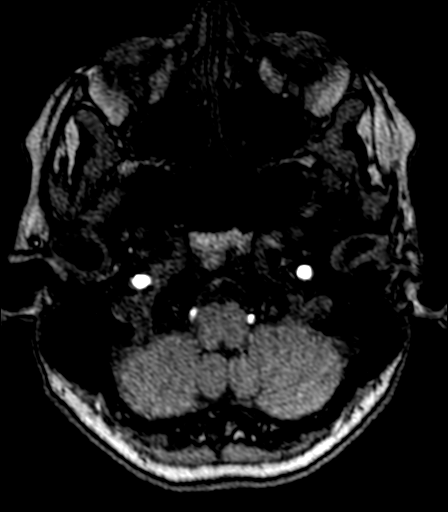
[im 34/143]
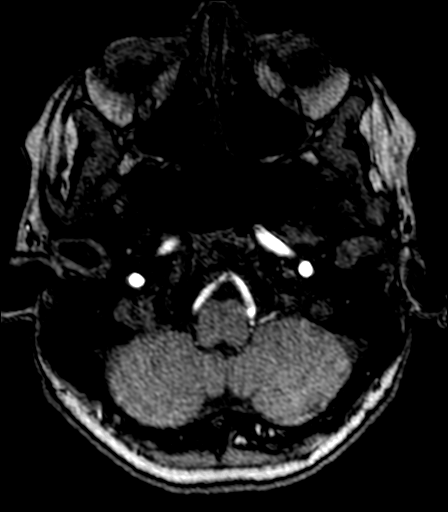
[im 37/143]
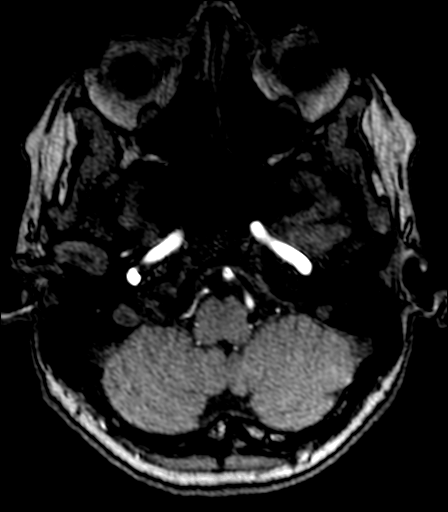
[im 40/143]
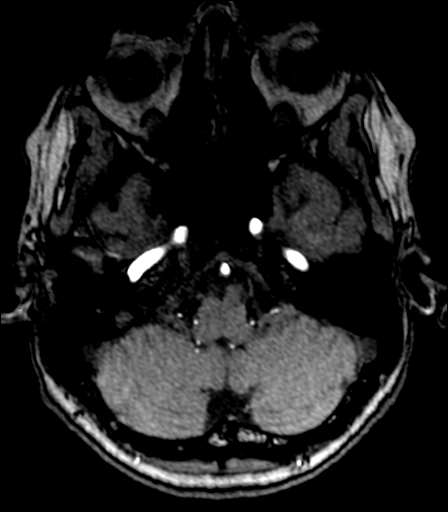
[im 46/143]
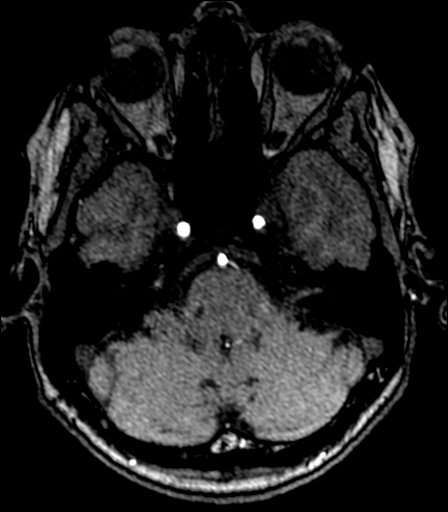
[im 64/143]
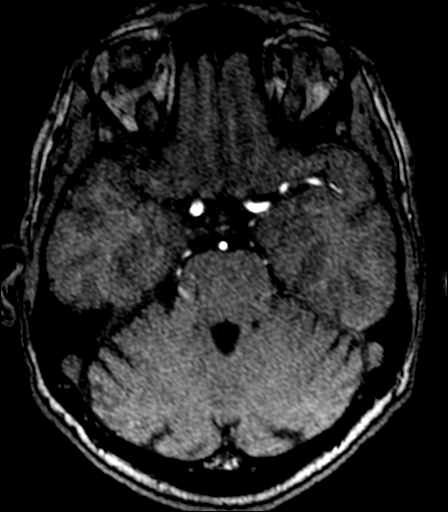
[im 73/143]
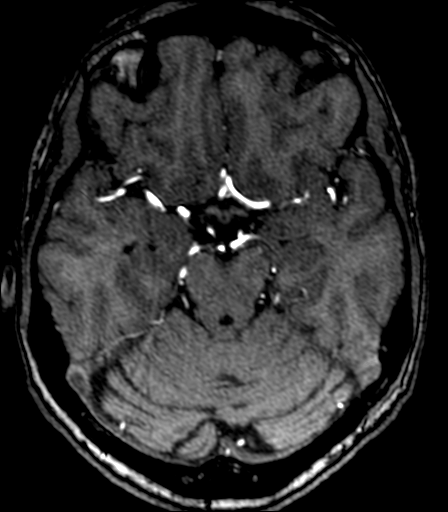
[im 82/143]
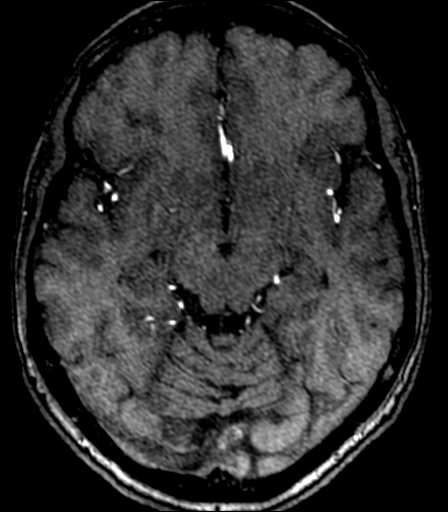
[im 100/143]
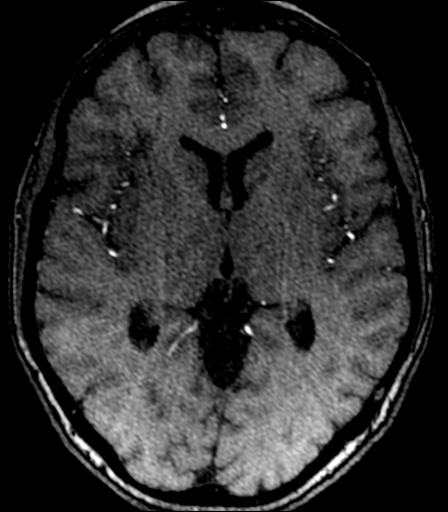
[im 118/143]
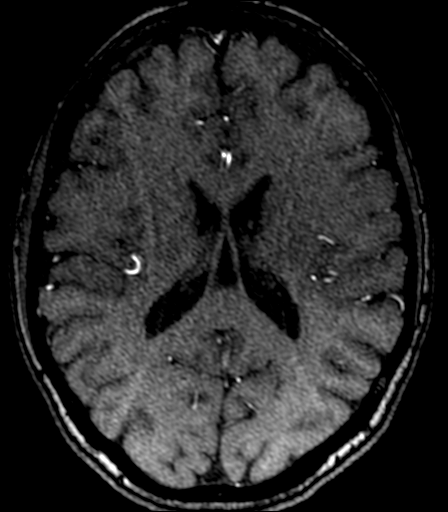
[im 121/143]
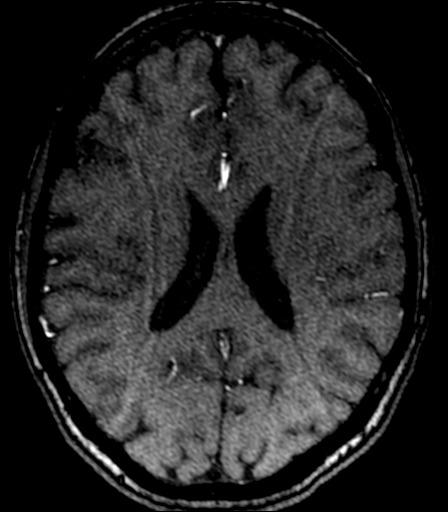
[im 136/143]
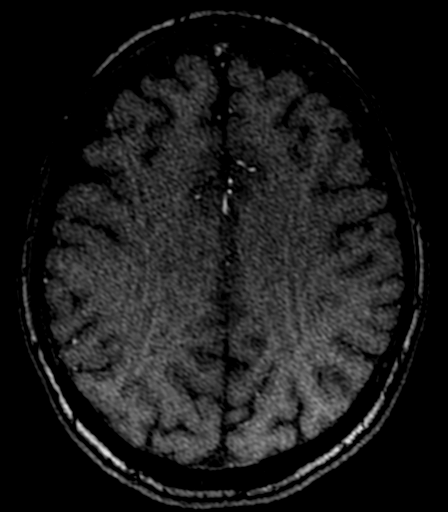

[22 of 48 positions shown; findings below may reference images not displayed]

FINDINGS: MRI HEAD FINDINGS

Brain:

Cerebral volume is normal.

No cortical encephalomalacia is identified. No significant cerebral
white matter disease.

There is no acute infarct.

No evidence of an intracranial mass.

No chronic intracranial blood products.

No extra-axial fluid collection.

No midline shift.

Vascular: Maintained flow voids within the proximal large arterial
vessels.

Skull and upper cervical spine: No focal suspicious marrow lesion.

Sinuses/Orbits: Visualized orbits show no acute finding. Trace
mucosal thickening within the bilateral ethmoid air cells.

MRA HEAD FINDINGS

Anterior circulation:

The intracranial internal carotid arteries are patent. The M1 middle
cerebral arteries are patent. No M2 proximal branch occlusion or
high-grade proximal stenosis is identified. The anterior cerebral
arteries are patent. 2 mm anteriorly projecting vascular protrusion
arising from the cavernous right internal carotid artery, likely
reflecting a small aneurysm (series 100, image 131) (series 11,
image 2).

Posterior circulation:

The intracranial vertebral arteries are patent. The basilar artery
is patent. The posterior cerebral arteries are patent. Posterior
communicating arteries are present bilaterally.

Anatomic variants: None significant.
IMPRESSION: MRI brain:

Unremarkable non-contrast MRI appearance of the brain. No evidence
of acute intracranial abnormality.

MRA head:

1. No intracranial large vessel occlusion or proximal high-grade
arterial stenosis.
2. 2 mm anteriorly projecting vascular protrusion arising from the
cavernous right internal carotid artery, likely reflecting an
aneurysm aneurysm.

## 2022-04-26 IMAGING — MR MR CERVICAL SPINE W/O CM
5 series · 35 of 48 positions shown · non-contrast
Comparison: None available.

CLINICAL DATA: Initial evaluation for chronic neck pain.

EXAM:
MRI CERVICAL SPINE WITHOUT CONTRAST
TECHNIQUE: Multiplanar, multisequence MR imaging of the cervical spine was
performed. No intravenous contrast was administered.

[Series 2: T2 · sagittal · 3.0mm · 0.41mm/px · 8 of 17 slices shown (1 of 2)]
[im 1/17]
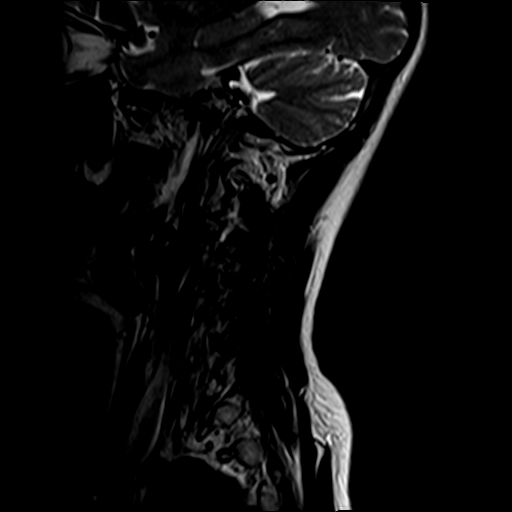
[im 3/17]
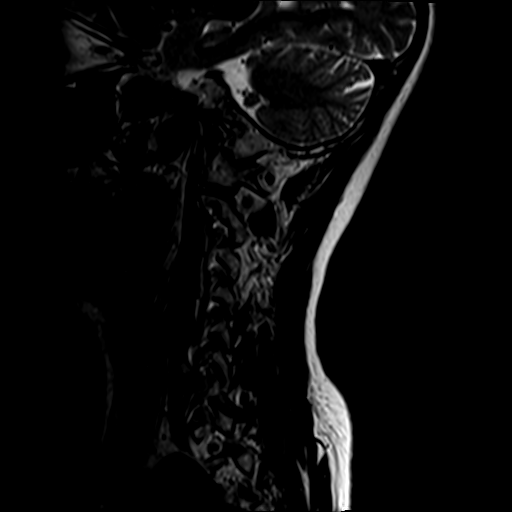
[im 5/17]
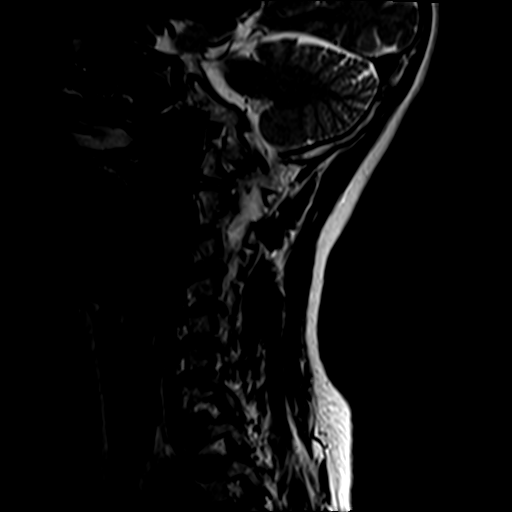
[im 7/17]
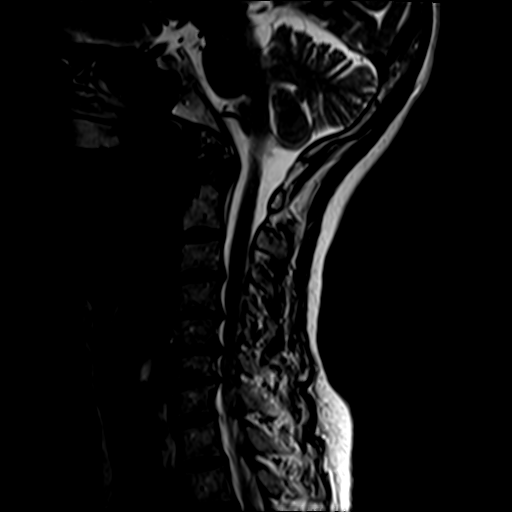
[im 10/17]
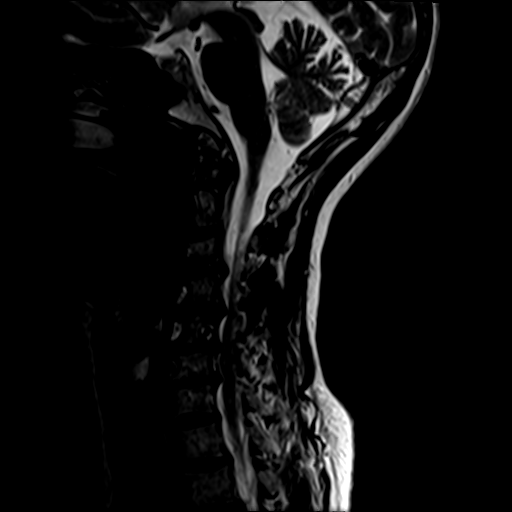
[im 12/17]
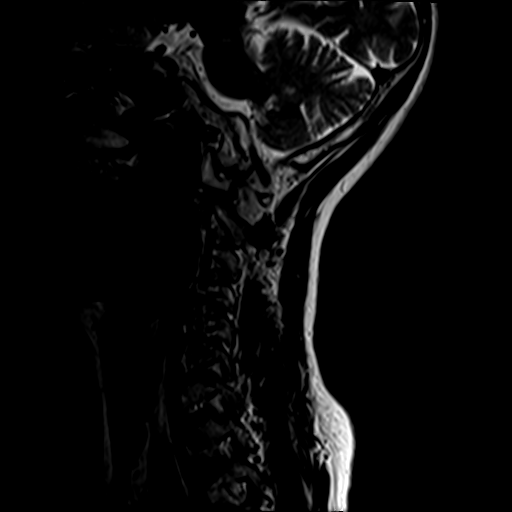
[im 14/17]
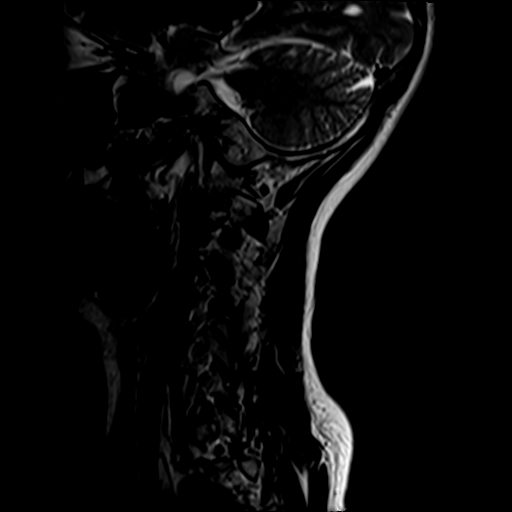
[im 17/17]
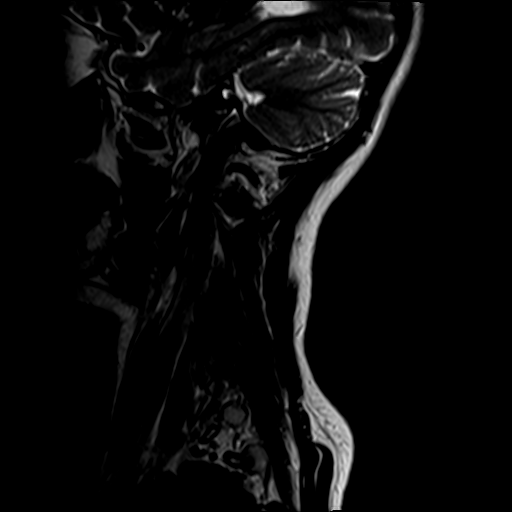

[Series 3: STIR · sagittal · 3.0mm · 0.82mm/px · 8 of 17 slices shown]
[im 1/17]
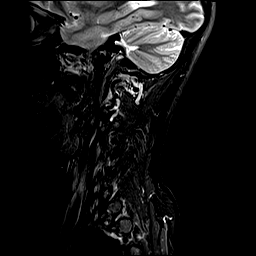
[im 3/17]
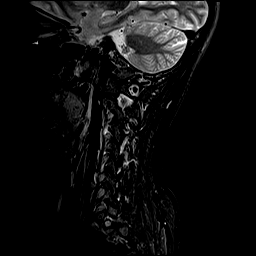
[im 5/17]
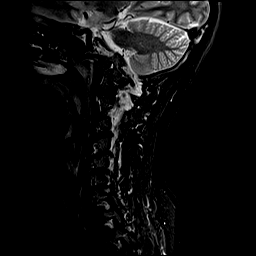
[im 7/17]
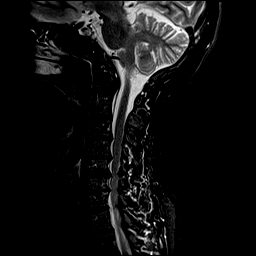
[im 10/17]
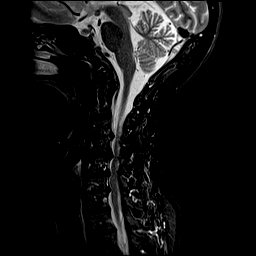
[im 12/17]
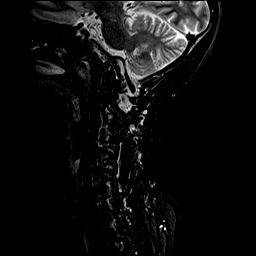
[im 14/17]
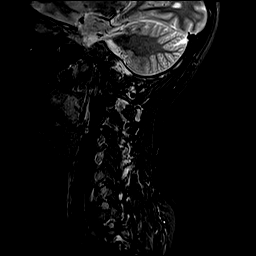
[im 17/17]
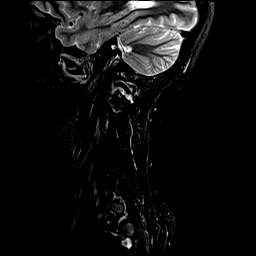

[Series 4: T1 · sagittal · 3.0mm · 0.82mm/px · 8 of 17 slices shown]
[im 1/17]
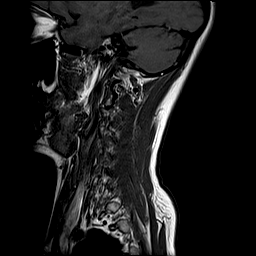
[im 3/17]
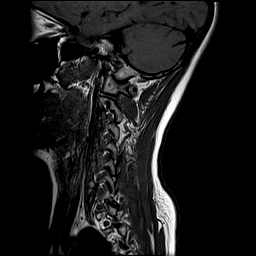
[im 5/17]
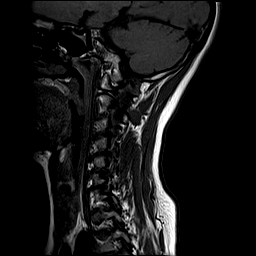
[im 7/17]
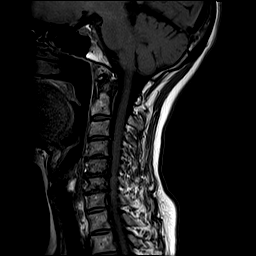
[im 10/17]
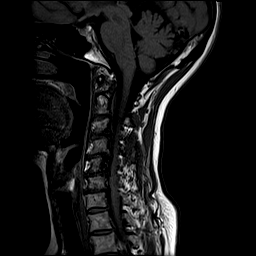
[im 12/17]
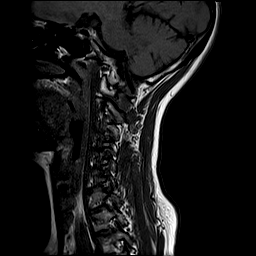
[im 14/17]
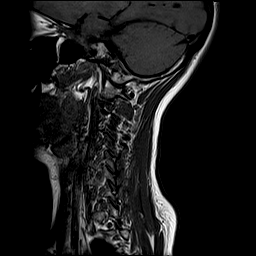
[im 17/17]
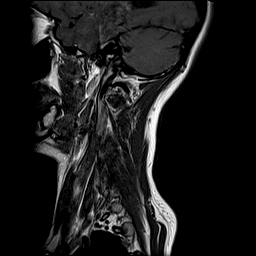

[Series 5: T2 · axial · 3.0mm · 0.70mm/px · z∈[-244,-151]mm · 9 of 25 slices shown (2 of 2)]
[im 1/25]
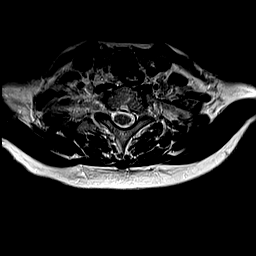
[im 5/25]
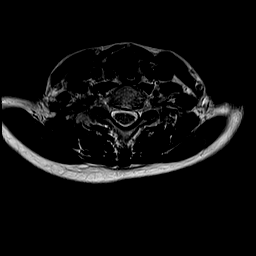
[im 7/25]
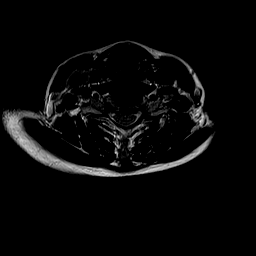
[im 11/25]
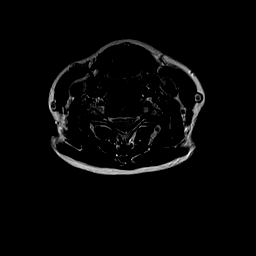
[im 14/25]
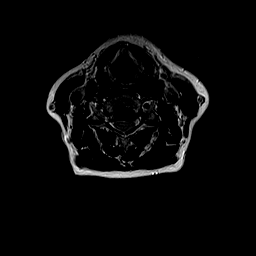
[im 18/25]
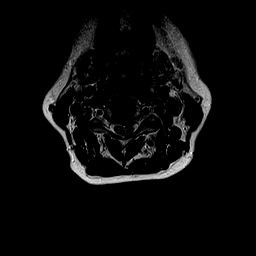
[im 20/25]
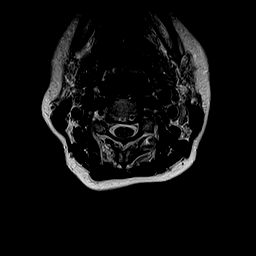
[im 22/25]
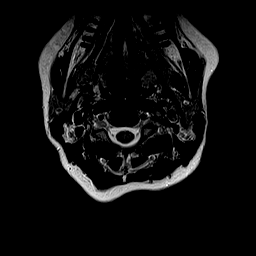
[im 25/25]
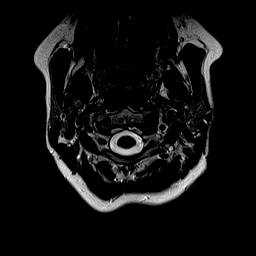

[Series 6: GRE · axial · 3.0mm · 0.35mm/px · z∈[-244,-229]mm · 2 of 26 slices shown]
[im 1/26]
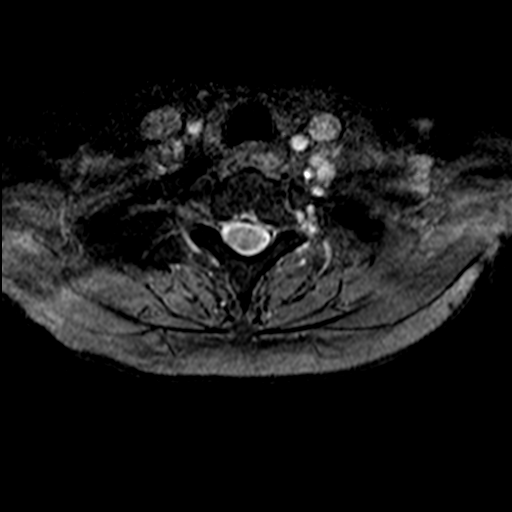
[im 5/26]
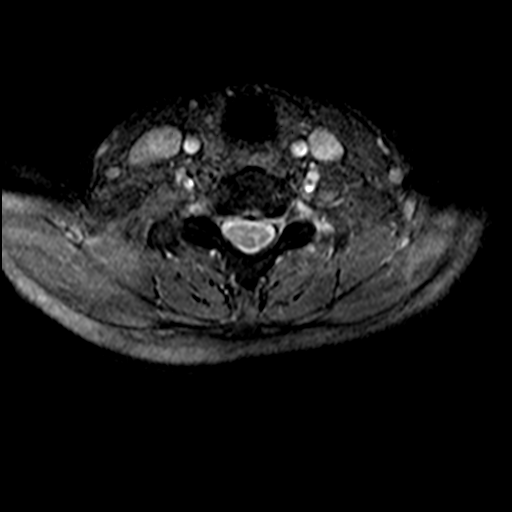

[35 of 48 positions shown; findings below may reference images not displayed]

FINDINGS: Alignment: Straightening of the normal cervical lordosis. Trace
degenerative anterolisthesis of C7 on T1 and T1 on T2.

Vertebrae: Vertebral body height maintained without acute or chronic
fracture. Bone marrow signal intensity within normal limits. No
discrete or worrisome osseous lesions. No abnormal marrow edema.

Cord: Normal signal and morphology.

Posterior Fossa, vertebral arteries, paraspinal tissues: Visualized
brain and posterior fossa within normal limits. Craniocervical
junction normal. Paraspinous soft tissues normal. Normal flow voids
seen within the vertebral arteries bilaterally.

Disc levels:

C2-C3: Unremarkable.

C3-C4: Degenerative intervertebral disc space narrowing with diffuse
disc osteophyte complex. Superimposed small central disc protrusion
indents the ventral thecal sac (series 6, image 9). Moderate spinal
stenosis with minimal cord flattening but no cord signal changes.
Moderate left with mild right C4 foraminal stenosis.

C4-C5: Degenerative intervertebral disc space narrowing with diffuse
disc osteophyte complex. Flattening and effacement of the ventral
thecal sac with moderate spinal stenosis. Moderate bilateral C5
foraminal narrowing.

C5-C6: Degenerative intervertebral disc space narrowing with diffuse
disc osteophyte complex. Superimposed more focal central disc
protrusion (series 6, image 17). Effacement of the ventral thecal
sac with resultant moderate spinal stenosis. Four flattening without
cord signal changes. Moderate right worse than left C6 foraminal
narrowing.

C6-C7: Degenerative intervertebral disc space narrowing with diffuse
disc osteophyte. Effacement and flattening of the ventral thecal sac
with resultant moderate spinal stenosis. Mild cord flattening
without cord signal changes. Severe right with moderate left C7
foraminal stenosis.

C7-T1: Disc bulge with uncovertebral spurring, greater on the left.
Facet and ligament flavum hypertrophy. No spinal stenosis. Mild left
C8 foraminal narrowing. Right neural foramina remains patent.

Visualized upper thoracic spine demonstrates mild noncompressive
disc bulging without significant stenosis.
IMPRESSION: 1. Multilevel cervical spondylosis with resultant moderate diffuse
spinal stenosis at C3-4 through C6-7.
2. Multifactorial degenerative changes with resultant multilevel
foraminal narrowing as above. Notable findings include moderate left
C4 foraminal stenosis, moderate bilateral C5 foraminal narrowing,
moderate right worse than left C6 foraminal stenosis, with severe
right and moderate left C7 foraminal narrowing.

## 2022-08-14 ENCOUNTER — Ambulatory Visit: Payer: BC Managed Care – PPO | Admitting: Internal Medicine

## 2022-08-14 ENCOUNTER — Encounter: Payer: Self-pay | Admitting: Internal Medicine

## 2022-08-14 VITALS — BP 124/74 | HR 53 | Temp 97.7°F | Ht 62.0 in | Wt 124.0 lb

## 2022-08-14 DIAGNOSIS — D696 Thrombocytopenia, unspecified: Secondary | ICD-10-CM | POA: Diagnosis not present

## 2022-08-14 DIAGNOSIS — Z0001 Encounter for general adult medical examination with abnormal findings: Secondary | ICD-10-CM

## 2022-08-14 DIAGNOSIS — R001 Bradycardia, unspecified: Secondary | ICD-10-CM

## 2022-08-14 DIAGNOSIS — R233 Spontaneous ecchymoses: Secondary | ICD-10-CM

## 2022-08-14 LAB — HEPATIC FUNCTION PANEL
ALT: 18 U/L (ref 0–35)
AST: 21 U/L (ref 0–37)
Albumin: 4.4 g/dL (ref 3.5–5.2)
Alkaline Phosphatase: 48 U/L (ref 39–117)
Bilirubin, Direct: 0.1 mg/dL (ref 0.0–0.3)
Total Bilirubin: 0.4 mg/dL (ref 0.2–1.2)
Total Protein: 7.1 g/dL (ref 6.0–8.3)

## 2022-08-14 LAB — BASIC METABOLIC PANEL
BUN: 15 mg/dL (ref 6–23)
CO2: 29 mEq/L (ref 19–32)
Calcium: 9.9 mg/dL (ref 8.4–10.5)
Chloride: 105 mEq/L (ref 96–112)
Creatinine, Ser: 0.66 mg/dL (ref 0.40–1.20)
GFR: 96.67 mL/min (ref >60.00)
Glucose, Bld: 95 mg/dL (ref 70–99)
Potassium: 4.1 mEq/L (ref 3.5–5.1)
Sodium: 141 mEq/L (ref 135–145)

## 2022-08-14 LAB — CBC WITH DIFFERENTIAL/PLATELET
Basophils Absolute: 0 10*3/uL (ref 0.0–0.1)
Basophils Relative: 0.6 % (ref 0.0–3.0)
Eosinophils Absolute: 0.1 10*3/uL (ref 0.0–0.7)
Eosinophils Relative: 1.7 % (ref 0.0–5.0)
HCT: 40.9 % (ref 36.0–46.0)
Hemoglobin: 13.5 g/dL (ref 12.0–15.0)
Lymphocytes Relative: 34.5 % (ref 12.0–46.0)
Lymphs Abs: 1.7 10*3/uL (ref 0.7–4.0)
MCHC: 33.1 g/dL (ref 30.0–36.0)
MCV: 86.1 fl (ref 78.0–100.0)
Monocytes Absolute: 0.5 10*3/uL (ref 0.1–1.0)
Monocytes Relative: 9.8 % (ref 3.0–12.0)
Neutro Abs: 2.7 10*3/uL (ref 1.4–7.7)
Neutrophils Relative %: 53.4 % (ref 43.0–77.0)
Platelets: 180 10*3/uL (ref 150.0–400.0)
RBC: 4.75 Mil/uL (ref 3.87–5.11)
RDW: 13.6 % (ref 11.5–15.5)
WBC: 5 10*3/uL (ref 4.0–10.5)

## 2022-08-14 LAB — LIPID PANEL
Cholesterol: 193 mg/dL (ref 0–200)
HDL: 72.4 mg/dL (ref >39.00)
LDL Cholesterol: 109 mg/dL — ABNORMAL HIGH (ref 0–99)
NonHDL: 120.37
Total CHOL/HDL Ratio: 3
Triglycerides: 55 mg/dL (ref 0.0–149.0)
VLDL: 11 mg/dL (ref 0.0–40.0)

## 2022-08-14 LAB — VITAMIN B12: Vitamin B-12: 1373 pg/mL — ABNORMAL HIGH (ref 211–911)

## 2022-08-14 LAB — TSH: TSH: 0.81 u[IU]/mL (ref 0.35–5.50)

## 2022-08-14 LAB — PROTIME-INR
INR: 0.9 ratio (ref 0.8–1.0)
Prothrombin Time: 10.4 s (ref 9.6–13.1)

## 2022-08-14 LAB — APTT: aPTT: 32.3 s (ref 25.4–36.8)

## 2022-08-14 LAB — FOLATE: Folate: >23.8 ng/mL (ref >5.9)

## 2022-08-14 NOTE — Patient Instructions (Signed)
Thrombocytopenia Thrombocytopenia is a condition in which there are a low number of platelets in the blood. Platelets are also called thrombocytes. Platelets are parts of blood that stick together and form a clot to help the body stop bleeding after an injury. If you have too few platelets, your blood may have trouble clotting. This may cause you to bleed and bruise very easily. Some cases of thrombocytopenia are mild while others are more severe. What are the causes? This condition is caused by a low number of platelets in your blood. There are three main reasons for this: Your body not making enough platelets. This may be caused by: Bone marrow diseases. This include aplastic anemia, leukemia, and myelodysplastic anemia. Congenital thrombocytopenia. This is a condition that is passed from parent to child (inherited). Certain cancer treatments, including chemotherapy and radiation therapy. Infections from bacteria or viruses. Alcohol use disorder and alcoholism. Platelets not being released in the blood. This is called platelet sequestration and it can happen due to: An overactive spleen (hypersplenism). The spleen gathers up platelets from circulation, meaning that the platelets are not available to help with clotting your blood. The spleen can be enlarged because of scarring or other conditions. Gaucher disease. Your body destroying platelets too quickly. This may be caused by: An autoimmune disease that causes immune thrombocytopenia (ITP). ITP is sometimes associated with other autoimmune conditions such as lupus. Certain medicines, such as blood thinners. Certain blood clotting or bleeding disorders. Exposure to toxic chemicals, such as pesticides, lead, benzene, and arsenic. Pregnancy. What are the signs or symptoms? Symptoms of this condition are the result of poor blood clotting. They will vary depending on how low the platelet counts are. Symptoms may include: Bruising  easily. Bleeding from the mouth or nose. Heavy menstrual periods. Blood in the urine, stool (feces), or vomit. Purplish-red discolorations on the skin (purpura). A rash that looks like pinpoint, purplish-red spots (petechiae) on the lower legs. How is this diagnosed?  This condition may be diagnosed with blood tests and a physical exam. You may also have other tests, including: A sample of bone marrow (biopsy) may be removed to look for the original cells that make platelets. An ultrasound or CT scan of the abdomen to check for an enlarged spleen, enlarged lymph nodes, or liver problems. How is this treated? Treatment for this condition depends on the cause. Treatment may include: Treatment of another condition that is causing the low platelet count. Medicines to help protect your platelets from being destroyed. A replacement (transfusion) of platelets to stop or prevent bleeding. Surgery to remove the spleen. Follow these instructions at home: Medicines Take over-the-counter and prescription medicines only as told by your health care provider. Do not take any medicines that contain aspirin or NSAIDs, such as ibuprofen. These medicines increase your risk for dangerous bleeding. Activity Avoid activities that could cause injury or bruising, and follow instructions about how to prevent falls. Do not play contact sports. Ask your health care provider what activities are safe for you. Take extra care to protect yourself from burns when ironing or cooking. Take extra care not to cut yourself when you shave or when you use scissors, needles, knives, and other tools. General instructions  Check your skin and the inside of your mouth for bruising or bleeding as told by your health care provider. Wear a medical alert bracelet that says that you have a bleeding disorder. This can help you get the treatment you need in case of emergency. Check   your urine and stool for blood as told by your health  care provider. Do not drink alcohol. If you do drink alcohol, limit the amount that you drink. Minimize contact with toxic chemicals. Tell all your health care providers, including dental care providers and eye doctors, about your condition. Make sure to tell dental care providers before you have any procedure done, including dental cleanings. Keep all follow-up visits. This is important. Contact a health care provider if: You have unexplained bruising. You have new symptoms. You have symptoms that get worse. You have a fever. Get help right away if: You have severe bleeding from anywhere on your body. You have blood in your vomit, urine, or stool. You have an injury to your head. You have a sudden, severe headache. Summary Thrombocytopenia is a condition in which you have a low number of platelets in the blood. Platelets are parts of blood that stick together to form a clot. Symptoms of this condition are the result of poor blood clotting and may include bruising easily, bleeding from the nose or mouth, petechiae, and purpura. This condition may be diagnosed with blood tests and a physical exam. Treatment for this condition depends on the cause. This information is not intended to replace advice given to you by your health care provider. Make sure you discuss any questions you have with your health care provider. Document Revised: 02/03/2021 Document Reviewed: 02/03/2021 Elsevier Patient Education  2023 [employer]  

## 2022-08-14 NOTE — Progress Notes (Unsigned)
Subjective:  Patient ID: Amber Richmond, female    DOB: Apr 05, 1966  Age: 56 y.o. MRN: [id number]  CC: Annual Exam   HPI [name] presents for a CPX and f/up -  She is concerned about a bruise on her right forearm. She does not remember trauma/injury. She is active and denies DOE, CP, SOB, edema, dizziness, lightheadedness, near-syncope, or syncope.  She has a history of thrombocytopenia.  Outpatient Medications Prior to Visit  Medication Sig Dispense Refill   Atogepant (QULIPTA) 60 MG TABS Take 1 tablet by mouth daily. 90 tablet 3   Misc Natural Products (GLUCOSAMINE CHOND CMP TRIPLE) TABS Take 1 tablet by mouth daily.     prochlorperazine (COMPAZINE) 10 MG tablet Take 1 tablet (10 mg total) by mouth every 12 (twelve) hours as needed for nausea or vomiting. 30 tablet 11   Ubrogepant (UBRELVY) 100 MG TABS Take 100 mg by mouth every 2 (two) hours as needed. Maximum 200mg  a day. 16 tablet 11   zolmitriptan (ZOMIG) 5 MG tablet Take 1 tablet (5 mg total) by mouth as needed for migraine. May repeat in 2 hours. Max twice a day. 10 tablet 11   No facility-administered medications prior to visit.    ROS Review of Systems  Constitutional: Negative.  Negative for chills, diaphoresis, fatigue and fever.  HENT: Negative.  Negative for nosebleeds, sore throat, trouble swallowing and voice change.   Eyes: Negative.   Respiratory:  Negative for cough, chest tightness, shortness of breath and wheezing.   Cardiovascular:  Negative for chest pain, palpitations and leg swelling.  Gastrointestinal:  Negative for abdominal pain, anal bleeding, blood in stool, diarrhea, nausea and vomiting.  Endocrine: Negative.   Genitourinary: Negative.  Negative for difficulty urinating, hematuria and vaginal bleeding.  Musculoskeletal: Negative.   Skin: Negative.   Neurological: Negative.  Negative for dizziness and weakness.  Hematological:  Negative for adenopathy. Bruises/bleeds easily.     Objective:  BP 124/74 (BP Location: Left Arm, Patient Position: Sitting, Cuff Size: Large)   Pulse (!) 53   Temp 97.7 F (36.5 C) (Oral)   Ht 5\' 2"  (1.575 m)   Wt 124 lb (56.2 kg)   SpO2 98%   BMI 22.68 kg/m   BP Readings from Last 3 Encounters:  08/14/22 124/74  01/18/22 102/64  08/10/21 133/82    Wt Readings from Last 3 Encounters:  08/14/22 124 lb (56.2 kg)  01/18/22 126 lb 9.6 oz (57.4 kg)  08/10/21 126 lb 6.4 oz (57.3 kg)    Physical Exam Vitals reviewed.  HENT:     Nose: Nose normal.     Mouth/Throat:     Mouth: Mucous membranes are moist.  Eyes:     General: No scleral icterus.    Conjunctiva/sclera: Conjunctivae normal.  Cardiovascular:     Rate and Rhythm: Regular rhythm. Bradycardia present.     Heart sounds: Normal heart sounds, S1 normal and S2 normal. No murmur heard.    No friction rub. No gallop.     Comments: EKG- SB, 55 bpm ?LAE Otherwise normal EKG Pulmonary:     Effort: Pulmonary effort is normal.     Breath sounds: No stridor. No wheezing, rhonchi or rales.  Abdominal:     General: Abdomen is flat.     Palpations: There is no mass.     Tenderness: There is no abdominal tenderness. There is no guarding.     Hernia: No hernia is present.  Musculoskeletal:  Cervical back: Neck supple.     Right lower leg: No edema.     Left lower leg: No edema.  Lymphadenopathy:     Cervical: No cervical adenopathy.  Skin:    General: Skin is warm and dry.     Coloration: Skin is not pale.     Findings: Bruising and ecchymosis present. No erythema, petechiae or rash.  Neurological:     General: No focal deficit present.     Mental Status: She is alert. Mental status is at baseline.  Psychiatric:        Mood and Affect: Mood normal.        Behavior: Behavior normal.     Lab Results  Component Value Date   WBC 5.0 08/14/2022   HGB 13.5 08/14/2022   HCT 40.9 08/14/2022   PLT 180.0 08/14/2022   GLUCOSE 95 08/14/2022   CHOL 193 08/14/2022    TRIG 55.0 08/14/2022   HDL 72.40 08/14/2022   LDLCALC 109 (H) 08/14/2022   ALT 18 08/14/2022   AST 21 08/14/2022   NA 141 08/14/2022   K 4.1 08/14/2022   CL 105 08/14/2022   CREATININE 0.66 08/14/2022   BUN 15 08/14/2022   CO2 29 08/14/2022   TSH 0.81 08/14/2022   INR 0.9 08/14/2022    MR Brain Wo Contrast  Result Date: 05/16/2021 CLINICAL DATA:  Headache, new or worsening. Ataxia, new daily persistent headache, new or worsening. EXAM: MRI HEAD WITHOUT CONTRAST MRA HEAD WITHOUT CONTRAST TECHNIQUE: Multiplanar, multi-echo pulse sequences of the brain and surrounding structures were acquired without intravenous contrast. Angiographic images of the Circle of Willis were acquired using MRA technique without intravenous contrast. COMPARISON:  Same-day MRI cervical spine 05/15/2021 FINDINGS: MRI HEAD FINDINGS Brain: Cerebral volume is normal. No cortical encephalomalacia is identified. No significant cerebral white matter disease. There is no acute infarct. No evidence of an intracranial mass. No chronic intracranial blood products. No extra-axial fluid collection. No midline shift. Vascular: Maintained flow voids within the proximal large arterial vessels. Skull and upper cervical spine: No focal suspicious marrow lesion. Sinuses/Orbits: Visualized orbits show no acute finding. Trace mucosal thickening within the bilateral ethmoid air cells. MRA HEAD FINDINGS Anterior circulation: The intracranial internal carotid arteries are patent. The M1 middle cerebral arteries are patent. No M2 proximal branch occlusion or high-grade proximal stenosis is identified. The anterior cerebral arteries are patent. 2 mm anteriorly projecting vascular protrusion arising from the cavernous right internal carotid artery, likely reflecting a small aneurysm (series 100, image 131) (series 11, image 2). Posterior circulation: The intracranial vertebral arteries are patent. The basilar artery is patent. The posterior cerebral  arteries are patent. Posterior communicating arteries are present bilaterally. Anatomic variants: None significant. IMPRESSION: MRI brain: Unremarkable non-contrast MRI appearance of the brain. No evidence of acute intracranial abnormality. MRA head: 1. No intracranial large vessel occlusion or proximal high-grade arterial stenosis. 2. 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 Electronically Signed   By: Jackey LogeKyle  Golden D.O.   On: 05/16/2021 08:59   MR Angiogram Head Wo Contrast  Result Date: 05/16/2021 CLINICAL DATA:  Headache, new or worsening. Ataxia, new daily persistent headache, new or worsening. EXAM: MRI HEAD WITHOUT CONTRAST MRA HEAD WITHOUT CONTRAST TECHNIQUE: Multiplanar, multi-echo pulse sequences of the brain and surrounding structures were acquired without intravenous contrast. Angiographic images of the Circle of Willis were acquired using MRA technique without intravenous contrast. COMPARISON:  Same-day MRI  cervical spine 05/15/2021 FINDINGS: MRI HEAD FINDINGS Brain: Cerebral volume is normal. No cortical encephalomalacia is identified. No significant cerebral white matter disease. There is no acute infarct. No evidence of an intracranial mass. No chronic intracranial blood products. No extra-axial fluid collection. No midline shift. Vascular: Maintained flow voids within the proximal large arterial vessels. Skull and upper cervical spine: No focal suspicious marrow lesion. Sinuses/Orbits: Visualized orbits show no acute finding. Trace mucosal thickening within the bilateral ethmoid air cells. MRA HEAD FINDINGS Anterior circulation: The intracranial internal carotid arteries are patent. The M1 middle cerebral arteries are patent. No M2 proximal branch occlusion or high-grade proximal stenosis is identified. The anterior cerebral arteries are patent. 2 mm anteriorly projecting vascular protrusion  arising from the cavernous right internal carotid artery, likely reflecting a small aneurysm (series 100, image 131) (series 11, image 2). Posterior circulation: The intracranial vertebral arteries are patent. The basilar artery is patent. The posterior cerebral arteries are patent. Posterior communicating arteries are present bilaterally. Anatomic variants: None significant. IMPRESSION: MRI brain: Unremarkable non-contrast MRI appearance of the brain. No evidence of acute intracranial abnormality. MRA head: 1. No intracranial large vessel occlusion or proximal high-grade arterial stenosis. 2. 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 Electronically Signed   By: Jackey Loge D.O.   On: 05/16/2021 08:59   MR Cervical Spine Wo Contrast  Result Date: 05/16/2021 CLINICAL DATA:  Initial evaluation for chronic neck pain. EXAM: MRI CERVICAL SPINE WITHOUT CONTRAST TECHNIQUE: Multiplanar, multisequence MR imaging of the cervical spine was performed. No intravenous contrast was administered. COMPARISON:  None available. FINDINGS: Alignment: Straightening of the normal cervical lordosis. Trace degenerative anterolisthesis of C7 on T1 and T1 on T2. Vertebrae: Vertebral body height maintained without acute or chronic fracture. Bone marrow signal intensity within normal limits. No discrete or worrisome osseous lesions. No abnormal marrow edema. Cord: Normal signal and morphology. Posterior Fossa, vertebral arteries, paraspinal tissues: Visualized brain and posterior fossa within normal limits. Craniocervical junction normal. Paraspinous soft tissues normal. Normal flow voids seen within the vertebral arteries bilaterally. Disc levels: C2-C3: Unremarkable. C3-C4: Degenerative intervertebral disc space narrowing with diffuse disc osteophyte complex. Superimposed small central disc protrusion indents the ventral thecal sac  (series 6, image 9). Moderate spinal stenosis with minimal cord flattening but no cord signal changes. Moderate left with mild right C4 foraminal stenosis. C4-C5: Degenerative intervertebral disc space narrowing with diffuse disc osteophyte complex. Flattening and effacement of the ventral thecal sac with moderate spinal stenosis. Moderate bilateral C5 foraminal narrowing. C5-C6: Degenerative intervertebral disc space narrowing with diffuse disc osteophyte complex. Superimposed more focal central disc protrusion (series 6, image 17). Effacement of the ventral thecal sac with resultant moderate spinal stenosis. Four flattening without cord signal changes. Moderate right worse than left C6 foraminal narrowing. C6-C7: Degenerative intervertebral disc space narrowing with diffuse disc osteophyte. Effacement and flattening of the ventral thecal sac with resultant moderate spinal stenosis. Mild cord flattening without cord signal changes. Severe right with moderate left C7 foraminal stenosis. C7-T1: Disc bulge with uncovertebral spurring, greater on the left. Facet and ligament flavum hypertrophy. No spinal stenosis. Mild left C8 foraminal narrowing. Right neural foramina remains patent. Visualized upper thoracic spine demonstrates mild noncompressive disc bulging without significant stenosis. IMPRESSION: 1. Multilevel cervical spondylosis with resultant moderate diffuse spinal stenosis at C3-4 through C6-7. 2. Multifactorial degenerative changes with resultant multilevel foraminal narrowing as above. Notable findings include moderate left C4 foraminal  stenosis, moderate bilateral C5 foraminal narrowing, moderate right worse than left C6 foraminal stenosis, with severe right and moderate left C7 foraminal narrowing. Electronically Signed   By: Rise Mu M.D.   On: 05/16/2021 04:46    Assessment & Plan:   Lysha was seen today for annual exam.  Diagnoses and all orders for this visit:  Easy bruising-  Labs are normal.  The ecchymosis appears benign. -     Basic metabolic panel; Future -     Hepatic function panel; Future -     Protime-INR; Future -     APTT; Future -     APTT -     Protime-INR -     Hepatic function panel -     Basic metabolic panel  Bradycardia- She is asymptomatic with this.  TSH is normal. -     EKG 12-Lead -     Basic metabolic panel; Future -     TSH; Future -     TSH -     Basic metabolic panel  Encounter for general adult medical examination with abnormal findings- Exam completed, labs reviewed, vaccines reviewed-she refused all vaccines, cancer screenings are up-to-date, patient education was given. -     Lipid panel; Future -     Lipid panel  Thrombocytopenia (HCC)- Her platelet count is normal now.  B12/folate are normal. -     Vitamin B12; Future -     Hepatic function panel; Future -     CBC with Differential/Platelet; Future -     Folate; Future -     Folate -     CBC with Differential/Platelet -     Hepatic function panel -     Vitamin B12   I am having Stark Klein D. Wechter maintain her prochlorperazine, Ubrelvy, Glucosamine Chond Cmp Triple, Qulipta, and zolmitriptan.  No orders of the defined types were placed in this encounter.    Follow-up: Return in about 3 months (around 11/13/2022).  Sanda Linger, MD

## 2022-08-15 ENCOUNTER — Encounter: Payer: Self-pay | Admitting: Neurology

## 2022-08-15 ENCOUNTER — Encounter: Payer: Self-pay | Admitting: Internal Medicine

## 2022-08-17 NOTE — Telephone Encounter (Addendum)
Opened PA on CMM. Key: [id number]. Awaiting questions from [facility].

## 2022-08-17 NOTE — Telephone Encounter (Signed)
Received approval from [employer]. Ubrelvy 100 mg approved 08/17/22 - 08/17/23. PA # [id number]

## 2022-08-17 NOTE — Telephone Encounter (Signed)
Completed PA. Awaiting determination from [employer].

## 2022-08-22 ENCOUNTER — Encounter: Payer: Self-pay | Admitting: Internal Medicine

## 2022-08-30 ENCOUNTER — Other Ambulatory Visit: Payer: Self-pay | Admitting: Internal Medicine

## 2022-08-31 ENCOUNTER — Other Ambulatory Visit: Payer: Self-pay | Admitting: Internal Medicine

## 2022-08-31 DIAGNOSIS — R7989 Other specified abnormal findings of blood chemistry: Secondary | ICD-10-CM | POA: Insufficient documentation

## 2022-08-31 DIAGNOSIS — R1084 Generalized abdominal pain: Secondary | ICD-10-CM

## 2022-08-31 DIAGNOSIS — R079 Chest pain, unspecified: Secondary | ICD-10-CM

## 2022-10-09 ENCOUNTER — Encounter: Payer: Self-pay | Admitting: Internal Medicine

## 2022-10-09 ENCOUNTER — Other Ambulatory Visit: Payer: Self-pay | Admitting: Internal Medicine

## 2022-10-09 DIAGNOSIS — R7989 Other specified abnormal findings of blood chemistry: Secondary | ICD-10-CM

## 2022-10-10 ENCOUNTER — Other Ambulatory Visit (INDEPENDENT_AMBULATORY_CARE_PROVIDER_SITE_OTHER): Payer: BC Managed Care – PPO

## 2022-10-10 DIAGNOSIS — R7989 Other specified abnormal findings of blood chemistry: Secondary | ICD-10-CM | POA: Diagnosis not present

## 2022-10-10 LAB — VITAMIN B12: Vitamin B-12: 798 pg/mL (ref 211–911)

## 2022-10-13 ENCOUNTER — Other Ambulatory Visit: Payer: BC Managed Care – PPO

## 2023-01-04 ENCOUNTER — Other Ambulatory Visit: Payer: Self-pay | Admitting: Neurology

## 2023-01-04 DIAGNOSIS — G43009 Migraine without aura, not intractable, without status migrainosus: Secondary | ICD-10-CM

## 2023-02-21 ENCOUNTER — Other Ambulatory Visit: Payer: Self-pay | Admitting: Neurology

## 2023-02-21 DIAGNOSIS — G43009 Migraine without aura, not intractable, without status migrainosus: Secondary | ICD-10-CM

## 2023-02-28 ENCOUNTER — Encounter: Payer: Self-pay | Admitting: Neurology

## 2023-02-28 ENCOUNTER — Ambulatory Visit: Payer: BC Managed Care – PPO | Admitting: Neurology

## 2023-02-28 VITALS — BP 118/66 | HR 63 | Ht 62.0 in | Wt 124.0 lb

## 2023-02-28 DIAGNOSIS — M5030 Other cervical disc degeneration, unspecified cervical region: Secondary | ICD-10-CM | POA: Diagnosis not present

## 2023-02-28 DIAGNOSIS — M4802 Spinal stenosis, cervical region: Secondary | ICD-10-CM

## 2023-02-28 DIAGNOSIS — G43009 Migraine without aura, not intractable, without status migrainosus: Secondary | ICD-10-CM

## 2023-02-28 MED ORDER — ZOLMITRIPTAN 5 MG PO TABS: 5.0000 mg | ORAL_TABLET | ORAL | 11 refills | Status: DC | PRN

## 2023-02-28 MED ORDER — QULIPTA 60 MG PO TABS: 1.0000 | ORAL_TABLET | Freq: Every day | ORAL | 3 refills | Status: DC

## 2023-02-28 MED ORDER — UBRELVY 100 MG PO TABS: 100.0000 mg | ORAL_TABLET | ORAL | 11 refills | Status: DC | PRN

## 2023-02-28 NOTE — Progress Notes (Signed)
Patient: Amber Richmond Date of Birth: [birth year]  Reason for Visit: Follow up History from: Patient Primary Neurologist: Lucia Gaskins   ASSESSMENT AND PLAN 57 y.o. year old female   1.  Chronic migraine headaches 2.  Chronic neck pain  -Amber Richmond is lovely!  Migraines are doing very well!  Only once a month -Continue Qulipta 60 mg daily for migraine prevention -Continue Zomig or Ubrelvy for acute migraine treatment -Check MRI cervical spine to reevaluate spinal stenosis for any worsening, Dr. Lucia Gaskins had requested annual evaluation.  Had consultation with Dr. Lorrine Kin and Dr. Johnsie Cancel.  She decided not to pursue injection, had saline injection  that was very painful -Follow-up in 1 year or sooner if needed  Meds ordered this encounter  Medications   zolmitriptan (ZOMIG) 5 MG tablet    Sig: Take 1 tablet (5 mg total) by mouth as needed for migraine. May repeat in 2 hours. Max twice a day.    Dispense:  10 tablet    Refill:  11   Ubrogepant (UBRELVY) 100 MG TABS    Sig: Take 1 tablet (100 mg total) by mouth every 2 (two) hours as needed. Maximum 200mg  a day.    Dispense:  16 tablet    Refill:  11    She has between 4 and 14 migraine+headache days total months for the last 6 months.  Can qualify for episodic migraines Ubrelvy.Episodic migraines failed imitrex, maxalt, relpax, zomig, and other medications   Atogepant (QULIPTA) 60 MG TABS    Sig: Take 1 tablet (60 mg total) by mouth daily.    Dispense:  90 tablet    Refill:  3   HISTORY OF PRESENT ILLNESS: Today 02/28/23 She last saw Dr. Lucia Gaskins in May 2023 doing well on Qulipta and Zomig.  Referred to Dr. Lorrine Kin for pain management intervention options for neck pain.  Plan to recheck MRI cervical spine yearly. Migraines are much better. On Qulipta for migraine prevention. Uses Zomig, Ubrelvy, Zomig works faster, but both are beneficial. Zomig has been her comfort rescue medication. Migraines are only once a month, and are much less severe!  Was lasting for days once a week. She works at [employer], able to work from home 2 days a week. Saw Dr. Lorrine Kin, still gets neck pain, can trigger migraine, had injection but reports was given saline injection, no medication was given, was frustrated, she decided to hold off. Neck pain is not daily, but mostly feels like she slept on it wrong. No radiation of pain. Pain is at base of neck, can radiate occipitally bilaterally triggering migraine. Has stand up desk.   HISTORY  01/18/2022: Bennie Pierini doing great. She doesn't even need ubrelvy or zomig. Her neck still hurts, she did fry needling, she went to physical therapy, still have a lot of tightness in the neck, no shooting pain down the arms.  No weakness or numbness. We ordered an mri cervical spine, reviewed below.    MRI cervical spine: IMPRESSION: personally reviewed and agree with the following 1. Multilevel cervical spondylosis with resultant moderate diffuse spinal stenosis at C3-4 through C6-7. 2. Multifactorial degenerative changes with resultant multilevel foraminal narrowing as above. Notable findings include moderate left C4 foraminal stenosis, moderate bilateral C5 foraminal narrowing, moderate right worse than left C6 foraminal stenosis, with severe right and moderate left C7 foraminal narrowing.   Addendum 09/12/2021: She has between 4 and 14 migraine+headache days total months for the last 6 months. Migraines are  pulsating/ pounding/ throbbing,  unilateral, nausea, phono/phonophobia, no aura, no medication overuse. Can qualify for episodic migraines and qulipta as she is doing very well on it. Also try Ubrelvy.   In September send myself an email for repeat c-spine   HPI 08/10/2021:  Amber Richmond is a 57 y.o. female here as requested by [name], MD for headaches. PMHx migraines, cervical spinal stenosis. Started as menstrual migraines after the birth of her daughter, she dealt with them, they fluctuated, she has seen  multiple neurologists, she had to go to the hospital, she saw a prominent neurologist in [place]. They did not go away after menopause. She is post menopausal now for 3 years. She has a history waking with the migraines in the morning, more consistently in the morning than anything. She has daily neck pain. In the past  she always had them on the left side, pulsating/pounding/throbbing, photo/phonophobia, nausea, has vomited in the past, movement would make it worse. When she wakes up she has severe neck pain. It hursts to put her neck pain back. She can feel the pain in her neck and thet is where the pain is coming from radiating up the back of the head. (Points to the emergence of the occipital nerves). She has numbness in her fingers. She had botox. She had cervical muscle pain and a lot of joint issues on the aimovig. Both daughters on migraines. No other focal neurologic deficits, associated symptoms, inciting events or modifiable factors.   She was on multiple preventatives: from a thorough review of records: aimovig (helped but had side effects), Emgality, botox, zomig, imitrex, compazine, qulipta, emgality, topamax, elavil, pamelor, depakote, inderal, excedrin with no benefit.  She tried Effexor did not like how it made her feel.  Naprosyn, Aleve, ibuprofen, Excedrin did not help, Migranal did not help.  Imitrex may help the best for severe migraines but Zomig works better for moderate migraines.  Uses Phenergan for nausea.   Reviewed notes, labs and imaging from outside physicians, which showed:   I reviewed notes from Dr. Johnsie Cancel, this is a 57 year old female with a history of migraines and neck pain, progressive over multiple years, improved with morning stretches of her neck but worsened throughout the day with normal activities, she has been taking daily NSAIDs, headaches are occipital base, fairly well time to the neck pain, no radicular pain at this time, no symptoms consistent with  myelopathy and review of symptoms but does have some vertigo.     I also reviewed notes from neurology Dr. Baldo Daub she was seen there with chronic migraine without aura in April 2022, tried Zomig, Aimovig worked well for the first month then she had side effects, now she is back up to frequent migraines being off the Aimovig, 20 migraine days per month, the patient has had 15 or more headache days per month of which 8 or more migraines lasting for longer than 4 hours if untreated for years.  Emgality did not seem to help.  She has eliminated sugars from her diet, she tried gluten-free, magnesium supplements, they are pulsating and pounding bitemporal sometimes in the occipital regions associated with photophobia and phonophobia nausea without vomiting.  Per his notes, the patient had a normal MRI of the head with and without contrast in May 2021.  She also reported neck pain which may be triggering her migraines.  No radicular symptoms.  They discussed Ajovy.  Other meds discussed atogepant and she was educated on medication overuse headaches.  They also discussed  myofascial pain syndrome, PT, trigger point injections  REVIEW OF SYSTEMS: Out of a complete 14 system review of symptoms, the patient complains only of the following symptoms, and all other reviewed systems are negative.  See HPI  ALLERGIES: No Known Allergies  HOME MEDICATIONS: Outpatient Medications Prior to Visit  Medication Sig Dispense Refill   Misc Natural Products (GLUCOSAMINE CHOND CMP TRIPLE) TABS Take 1 tablet by mouth daily.     prochlorperazine (COMPAZINE) 10 MG tablet Take 1 tablet (10 mg total) by mouth every 12 (twelve) hours as needed for nausea or vomiting. 30 tablet 11   Atogepant (QULIPTA) 60 MG TABS Take 1 tablet by mouth daily. 90 tablet 3   Ubrogepant (UBRELVY) 100 MG TABS Take 100 mg by mouth every 2 (two) hours as needed. Maximum 200mg  a day. 16 tablet 11   zolmitriptan (ZOMIG) 5 MG tablet Take 1 tablet (5 mg  total) by mouth as needed for migraine. May repeat in 2 hours. Max twice a day. (Patient not taking: Reported on 02/28/2023) 10 tablet 11   No facility-administered medications prior to visit.    PAST MEDICAL HISTORY: Past Medical History:  Diagnosis Date   Migraines     PAST SURGICAL HISTORY: Past Surgical History:  Procedure Laterality Date   BREAST ENHANCEMENT SURGERY     GALLBLADDER SURGERY     tummy tuck      FAMILY HISTORY: Family History  Problem Relation Age of Onset   Diabetes Mother    COPD Mother    Hypertension Father    Cancer Father        skin, leukemia   Heart disease Father    Diabetes Father    Cataracts Father    Diabetes Maternal Grandmother    Migraines Neg Hx     SOCIAL HISTORY: Social History   Socioeconomic History   Marital status: Unknown    Spouse name: Not on file   Number of children: Not on file   Years of education: Not on file   Highest education level: Not on file  Occupational History   Not on file  Tobacco Use   Smoking status: Never   Smokeless tobacco: Never  Vaping Use   Vaping Use: Never used  Substance and Sexual Activity   Alcohol use: Yes    Alcohol/week: 1.0 standard drink of alcohol    Types: 1 Glasses of wine per week   Drug use: Never   Sexual activity: Not Currently  Other Topics Concern   Not on file  Social History Narrative   Caffeine- decaff one cup daily.  Education HS,.  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   Right handed   Lives alone   Social Determinants of Health   Financial Resource Strain: Not on file  Food Insecurity: Not on file  Transportation Needs: Not on file  Physical Activity: Not on file  Stress: Not on file  Social Connections: Not on file  Intimate Partner Violence: Not on file    PHYSICAL EXAM  Vitals:   02/28/23 1058  BP: 118/66  Pulse: 63  SpO2: 98%  Weight: 124 lb (56.2 kg)  Height: 5\' 2"  (1.575 m)   Body mass index is 22.68 kg/m.  Generalized: Well  developed, in no acute distress  Neurological examination  Mentation: Alert oriented to time, place, history taking. Follows all commands speech and language fluent Cranial nerve II-XII: Pupils were equal round reactive to light. Extraocular movements were full, visual field were full on confrontational  test. Facial sensation and strength were normal.  Head turning and shoulder shrug  were normal and symmetric. Motor: The motor testing reveals 5 over 5 strength of all 4 extremities. Good symmetric motor tone is noted throughout.  Sensory: Sensory testing is intact to soft touch on all 4 extremities. No evidence of extinction is noted.  Coordination: Cerebellar testing reveals good finger-nose-finger and heel-to-shin bilaterally.  Gait and station: Gait is normal. Tandem gait is normal.  Reflexes: Deep tendon reflexes are symmetric and normal bilaterally.   DIAGNOSTIC DATA (LABS, IMAGING, TESTING) - I reviewed patient records, labs, notes, testing and imaging myself where available.  Lab Results  Component Value Date   WBC 5.0 08/14/2022   HGB 13.5 08/14/2022   HCT 40.9 08/14/2022   MCV 86.1 08/14/2022   PLT 180.0 08/14/2022      Component Value Date/Time   NA 141 08/14/2022 0956   K 4.1 08/14/2022 0956   CL 105 08/14/2022 0956   CO2 29 08/14/2022 0956   GLUCOSE 95 08/14/2022 0956   BUN 15 08/14/2022 0956   CREATININE 0.66 08/14/2022 0956   CALCIUM 9.9 08/14/2022 0956   PROT 7.1 08/14/2022 0956   ALBUMIN 4.4 08/14/2022 0956   AST 21 08/14/2022 0956   ALT 18 08/14/2022 0956   ALKPHOS 48 08/14/2022 0956   BILITOT 0.4 08/14/2022 0956   Lab Results  Component Value Date   CHOL 193 08/14/2022   HDL 72.40 08/14/2022   LDLCALC 109 (H) 08/14/2022   TRIG 55.0 08/14/2022   CHOLHDL 3 08/14/2022   No results found for: "HGBA1C" Lab Results  Component Value Date   VITAMINB12 798 10/10/2022   Lab Results  Component Value Date   TSH 0.81 08/14/2022    Margie Ege, AGNP-C, DNP  02/28/2023, 11:34 AM [facility] [address], [address] [place], [place] [address] [phone]

## 2023-02-28 NOTE — Patient Instructions (Signed)
So great to see you and meet you today! I will refill your medications, continue current regimen for migraine management I will order MRI cervical spine to reevaluate cervical stenosis Plan to see back in 1 year or sooner if needed. Thanks!! :)

## 2023-03-01 ENCOUNTER — Telehealth: Payer: Self-pay | Admitting: Neurology

## 2023-03-01 NOTE — Telephone Encounter (Signed)
[employer] NPR via [employer] sent to GI [phone] 

## 2023-03-03 ENCOUNTER — Telehealth: Payer: Self-pay | Admitting: Pharmacy Technician

## 2023-03-03 NOTE — Telephone Encounter (Signed)
Pharmacy Patient Advocate Encounter   Received notification from CoverMyMeds that prior authorization for Qulipta 60MG  tablets is required/requested.    PA started via CoverMyMeds. KEY [id number] . Waiting for clinical questions to populate.

## 2023-03-05 ENCOUNTER — Other Ambulatory Visit (HOSPITAL_COMMUNITY): Payer: Self-pay

## 2023-03-05 NOTE — Telephone Encounter (Signed)
Pharmacy Patient Advocate Encounter  Prior Authorization for Qulipta 60MG  tablets has been APPROVED by [facility] from 03/05/2023 to 03/04/2024.   PA # PA Case ID: [id number]

## 2023-03-21 ENCOUNTER — Ambulatory Visit
Admission: RE | Admit: 2023-03-21 | Discharge: 2023-03-21 | Disposition: A | Discharge status: Home / Self Care | Payer: BC Managed Care – PPO | Source: Ambulatory Visit | Attending: Neurology | Admitting: Neurology

## 2023-03-21 DIAGNOSIS — M5030 Other cervical disc degeneration, unspecified cervical region: Secondary | ICD-10-CM | POA: Diagnosis not present

## 2023-03-21 DIAGNOSIS — M4802 Spinal stenosis, cervical region: Secondary | ICD-10-CM

## 2023-09-10 DIAGNOSIS — Z1329 Encounter for screening for other suspected endocrine disorder: Secondary | ICD-10-CM | POA: Diagnosis not present

## 2023-09-10 DIAGNOSIS — Z131 Encounter for screening for diabetes mellitus: Secondary | ICD-10-CM | POA: Diagnosis not present

## 2023-09-10 DIAGNOSIS — Z0000 Encounter for general adult medical examination without abnormal findings: Secondary | ICD-10-CM | POA: Diagnosis not present

## 2023-09-10 DIAGNOSIS — L659 Nonscarring hair loss, unspecified: Secondary | ICD-10-CM | POA: Diagnosis not present

## 2023-09-10 DIAGNOSIS — Z01419 Encounter for gynecological examination (general) (routine) without abnormal findings: Secondary | ICD-10-CM | POA: Diagnosis not present

## 2023-09-10 DIAGNOSIS — Z1231 Encounter for screening mammogram for malignant neoplasm of breast: Secondary | ICD-10-CM | POA: Diagnosis not present

## 2023-09-10 DIAGNOSIS — Z1331 Encounter for screening for depression: Secondary | ICD-10-CM | POA: Diagnosis not present

## 2023-09-10 DIAGNOSIS — Z13220 Encounter for screening for lipoid disorders: Secondary | ICD-10-CM | POA: Diagnosis not present

## 2023-09-19 ENCOUNTER — Telehealth: Payer: Self-pay | Admitting: Pharmacy Technician

## 2023-09-19 ENCOUNTER — Other Ambulatory Visit (HOSPITAL_COMMUNITY): Payer: Self-pay

## 2023-09-19 NOTE — Telephone Encounter (Signed)
 Pharmacy Patient Advocate Encounter   Received notification from CoverMyMeds that prior authorization for Ubrelvy  100MG  tablets is required/requested.   Insurance verification completed.   The patient is insured through [facility] .   Per test claim: PA required; PA started via CoverMyMeds. KEY [id number] . Waiting for clinical questions to populate.

## 2023-09-19 NOTE — Telephone Encounter (Signed)
 Pharmacy Patient Advocate Encounter  Received notification from [facility] that Prior Authorization for Ubrelvy  100MG  tablets has been APPROVED from 09/19/2023 to 09/18/2024   PA #/Case ID/Reference #: [id number]

## 2024-01-08 DIAGNOSIS — R051 Acute cough: Secondary | ICD-10-CM | POA: Diagnosis not present

## 2024-02-04 ENCOUNTER — Telehealth: Payer: Self-pay

## 2024-02-04 ENCOUNTER — Other Ambulatory Visit (HOSPITAL_COMMUNITY): Payer: Self-pay

## 2024-02-04 NOTE — Telephone Encounter (Signed)
 Pharmacy Patient Advocate Encounter  Received notification from [facility] that Prior Authorization for Qulipta  60MG  tablets has been APPROVED from 02/04/2024 to 02/03/2025   PA #/Case ID/Reference #: PA Case ID #: [id number]

## 2024-02-04 NOTE — Telephone Encounter (Signed)
 Clinical questions have been answered and PA submitted. PA currently Pending. Please be advised that most companies allow up to 30 days to make a decision. We will advise when a determination has been made, or follow up in 1 week.   Please reach out to our team, Rx Prior Auth Pool, if you haven't heard back in a week.

## 2024-02-04 NOTE — Telephone Encounter (Signed)
 Pharmacy Patient Advocate Encounter   Received notification from Fax that prior authorization for Qulipta  60MG  tablets is required/requested.   Insurance verification completed.   The patient is insured through [facility] .   Per test claim: PA required; PA started via CoverMyMeds. KEY [id number] . Waiting for clinical questions to populate.

## 2024-02-28 ENCOUNTER — Other Ambulatory Visit: Payer: Self-pay | Admitting: Neurology

## 2024-02-28 DIAGNOSIS — G43009 Migraine without aura, not intractable, without status migrainosus: Secondary | ICD-10-CM

## 2024-03-04 ENCOUNTER — Other Ambulatory Visit (HOSPITAL_COMMUNITY): Payer: Self-pay

## 2024-03-15 ENCOUNTER — Other Ambulatory Visit: Payer: Self-pay | Admitting: Neurology

## 2024-03-15 DIAGNOSIS — G43009 Migraine without aura, not intractable, without status migrainosus: Secondary | ICD-10-CM

## 2024-03-17 ENCOUNTER — Telehealth: Payer: Self-pay | Admitting: Neurology

## 2024-03-17 DIAGNOSIS — G43009 Migraine without aura, not intractable, without status migrainosus: Secondary | ICD-10-CM

## 2024-03-17 MED ORDER — UBRELVY 100 MG PO TABS
100.0000 mg | ORAL_TABLET | ORAL | 11 refills | Status: AC | PRN
Start: 2024-03-17 — End: ?

## 2024-03-17 MED ORDER — ZOLMITRIPTAN 5 MG PO TABS
5.0000 mg | ORAL_TABLET | ORAL | 11 refills | Status: AC | PRN
Start: 2024-03-17 — End: ?

## 2024-03-17 NOTE — Telephone Encounter (Signed)
 Refilled as requested

## 2024-03-17 NOTE — Telephone Encounter (Signed)
 Pt has scheduled her 1 yr f/u, she is in need of a refill on her zolmitriptan  (ZOMIG ) 5 MG tablet &  Ubrogepant  (UBRELVY ) 100 MG TABS  [facility] [phone]

## 2024-03-18 ENCOUNTER — Encounter: Payer: Self-pay | Admitting: Neurology

## 2024-03-18 ENCOUNTER — Ambulatory Visit: Admitting: Neurology

## 2024-03-18 VITALS — BP 120/68 | Resp 15 | Ht 62.0 in | Wt 127.5 lb

## 2024-03-18 DIAGNOSIS — M5030 Other cervical disc degeneration, unspecified cervical region: Secondary | ICD-10-CM | POA: Diagnosis not present

## 2024-03-18 DIAGNOSIS — M4802 Spinal stenosis, cervical region: Secondary | ICD-10-CM

## 2024-03-18 DIAGNOSIS — G43009 Migraine without aura, not intractable, without status migrainosus: Secondary | ICD-10-CM | POA: Diagnosis not present

## 2024-03-18 MED ORDER — QULIPTA 60 MG PO TABS: 1.0000 | ORAL_TABLET | Freq: Every day | ORAL | 3 refills | Status: AC

## 2024-03-18 NOTE — Patient Instructions (Addendum)
 Continue current medications.  Check brain imaging and cervical spine imaging.  Avoid daily Excedrin Migraine use due to potential for rebound headache.

## 2024-03-18 NOTE — Progress Notes (Signed)
 Patient: Amber Richmond Date of Birth: [birth year]  Reason for Visit: Follow up History from: Patient Primary Neurologist: Ines   ASSESSMENT AND PLAN 58 y.o. year old female   1.  Chronic migraine headaches 2.  Chronic neck pain  - Migraines under reasonably good control about 6 per month, but daily headache felt to be related to cervical stenosis - Check MRI of the brain, MRA head, MRI cervical spine due to report of gait instability, lightheaded sensation overall surveillance.  2022 MRA head showed 2 mm right internal carotid artery aneurysm, will re-image for surveillance given above symptoms, patient is concerned  - July 2024 had MRI cervical spine showing various disc bulging with mild spinal stenosis at C5-6, C6-7, several areas of severe bilateral foraminal stenosis  - Went to [facility] for consideration of ESI, had trial run with saline that was extremely painful, prefers to avoid if possible.  Was seeing Dr. Rockney who is no longer at the practice.   - Tried and failed physical therapy -For migraines: -Continue Qulipta  60 mg daily for migraine prevention -Continue Zomig  or Ubrelvy  for acute migraine treatment -Avoid daily Excedrin Migraine use due to potential for rebound headache. -Follow up in 6 months with Dr. Ines   03/21/23 MRI cervical spine (without) demonstrating:  - At C5-6 disc bulging and facet hypertrophy with mild spinal stenosis and severe bilateral foraminal stenosis. - At C6-7 disc bulging with mild spinal stenosis and severe right foraminal stenosis. - At C4-5 disc bulging and uncovertebral joint hypertrophy with mild bilateral foraminal stenosis.  Orders Placed This Encounter  Procedures   MR BRAIN WO CONTRAST   MR ANGIO HEAD WO CONTRAST   MR CERVICAL SPINE WO CONTRAST   HISTORY OF PRESENT ILLNESS: Today 03/18/24 Needs refill on Qulipta . 6 migraines a month on average. Often in the morning has headache, thinks coming from her neck.  Has been afraid to go back to [facility], had saline injection as trial that was so painful. Mentions sometimes her gait is off, all of a sudden will walk into a walk. Sometimes feels dizzy and light headed. She still dances, has never lost her footing or has fallen. Uses Ubrelvy  and Zomig  for her migraines, Ubrelvy  works best. July 2024 had MRI cervical spine showing various disc bulging with mild spinal stenosis at C5-6, C6-7, several areas of severe bilateral foraminal stenosis. Takes Excedrin migraine daily for occipital headache triggered by neck pain.   MRI cervical spine (without) demonstrating:  - At C5-6 disc bulging and facet hypertrophy with mild spinal stenosis and severe bilateral foraminal stenosis. - At C6-7 disc bulging with mild spinal stenosis and severe right foraminal stenosis. - At C4-5 disc bulging and uncovertebral joint hypertrophy with mild bilateral foraminal stenosis.  02/28/23 SS: She last saw Dr. Ines in May 2023 doing well on Qulipta  and Zomig .  Referred to Dr. Darlis for pain management intervention options for neck pain.  Plan to recheck MRI cervical spine yearly. Migraines are much better. On Qulipta  for migraine prevention. Uses Zomig , Ubrelvy , Zomig  works faster, but both are beneficial. Zomig  has been her comfort rescue medication. Migraines are only once a month, and are much less severe! Was lasting for days once a week. She works at [employer], able to work from home 2 days a week. Saw Dr. Darlis, still gets neck pain, can trigger migraine, had injection but reports was given saline injection, no medication was given, was frustrated, she decided to hold off. Neck  pain is not daily, but mostly feels like she slept on it wrong. No radiation of pain. Pain is at base of neck, can radiate occipitally bilaterally triggering migraine. Has stand up desk.   HISTORY  01/18/2022: Qulipta  doing great. She doesn't even need ubrelvy  or zomig . Her neck still hurts,  she did fry needling, she went to physical therapy, still have a lot of tightness in the neck, no shooting pain down the arms.  No weakness or numbness. We ordered an mri cervical spine, reviewed below.    MRI cervical spine: IMPRESSION: personally reviewed and agree with the following 1. Multilevel cervical spondylosis with resultant moderate diffuse spinal stenosis at C3-4 through C6-7. 2. Multifactorial degenerative changes with resultant multilevel foraminal narrowing as above. Notable findings include moderate left C4 foraminal stenosis, moderate bilateral C5 foraminal narrowing, moderate right worse than left C6 foraminal stenosis, with severe right and moderate left C7 foraminal narrowing.   Addendum 09/12/2021: She has between 4 and 14 migraine+headache days total months for the last 6 months. Migraines are  pulsating/ pounding/ throbbing, unilateral, nausea, phono/phonophobia, no aura, no medication overuse. Can qualify for episodic migraines and qulipta  as she is doing very well on it. Also try Ubrelvy .   In September send myself an email for repeat c-spine   HPI 08/10/2021:  Amber Richmond is a 58 y.o. female here as requested by Joshua Debby CROME, MD for headaches. PMHx migraines, cervical spinal stenosis. Started as menstrual migraines after the birth of her daughter, she dealt with them, they fluctuated, she has seen multiple neurologists, she had to go to the hospital, she saw a prominent neurologist in [place] . They did not go away after menopause. She is post menopausal now for 3 years. She has a history waking with the migraines in the morning, more consistently in the morning than anything. She has daily neck pain. In the past  she always had them on the left side, pulsating/pounding/throbbing, photo/phonophobia, nausea, has vomited in the past, movement would make it worse. When she wakes up she has severe neck pain. It hursts to put her neck pain back. She can feel the pain in her  neck and thet is where the pain is coming from radiating up the back of the head. (Points to the emergence of the occipital nerves). She has numbness in her fingers. She had botox. She had cervical muscle pain and a lot of joint issues on the aimovig. Both daughters on migraines. No other focal neurologic deficits, associated symptoms, inciting events or modifiable factors.   She was on multiple preventatives: from a thorough review of records: aimovig (helped but had side effects), Emgality, botox, zomig , imitrex, compazine , qulipta , emgality, topamax, elavil, pamelor, depakote, inderal, excedrin with no benefit.  She tried Effexor did not like how it made her feel.  Naprosyn, Aleve, ibuprofen, Excedrin did not help, Migranal did not help.  Imitrex may help the best for severe migraines but Zomig  works better for moderate migraines.  Uses Phenergan for nausea.   Reviewed notes, labs and imaging from outside physicians, which showed:   I reviewed notes from Dr. Rockney, this is a 58 year old female with a history of migraines and neck pain, progressive over multiple years, improved with morning stretches of her neck but worsened throughout the day with normal activities, she has been taking daily NSAIDs, headaches are occipital base, fairly well time to the neck pain, no radicular pain at this time, no symptoms consistent with myelopathy and review of symptoms  but does have some vertigo.     I also reviewed notes from neurology Dr. Lawton she was seen there with chronic migraine without aura in April 2022, tried Zomig , Aimovig worked well for the first month then she had side effects, now she is back up to frequent migraines being off the Aimovig, 20 migraine days per month, the patient has had 15 or more headache days per month of which 8 or more migraines lasting for longer than 4 hours if untreated for years.  Emgality did not seem to help.  She has eliminated sugars from her diet, she tried  gluten-free, magnesium supplements, they are pulsating and pounding bitemporal sometimes in the occipital regions associated with photophobia and phonophobia nausea without vomiting.  Per his notes, the patient had a normal MRI of the head with and without contrast in May 2021.  She also reported neck pain which may be triggering her migraines.  No radicular symptoms.  They discussed Ajovy.  Other meds discussed atogepant  and she was educated on medication overuse headaches.  They also discussed myofascial pain syndrome, PT, trigger point injections  REVIEW OF SYSTEMS: Out of a complete 14 system review of symptoms, the patient complains only of the following symptoms, and all other reviewed systems are negative.  See HPI  ALLERGIES: No Known Allergies  HOME MEDICATIONS: Outpatient Medications Prior to Visit  Medication Sig Dispense Refill   Atogepant  (QULIPTA ) 60 MG TABS Take 1 tablet (60 mg total) by mouth daily. 90 tablet 3   Misc Natural Products (GLUCOSAMINE CHOND CMP TRIPLE) TABS Take 1 tablet by mouth daily.     prochlorperazine  (COMPAZINE ) 10 MG tablet Take 1 tablet (10 mg total) by mouth every 12 (twelve) hours as needed for nausea or vomiting. 30 tablet 11   Ubrogepant  (UBRELVY ) 100 MG TABS Take 1 tablet (100 mg total) by mouth every 2 (two) hours as needed. Maximum 200mg  a day. 16 tablet 11   zolmitriptan  (ZOMIG ) 5 MG tablet Take 1 tablet (5 mg total) by mouth as needed for migraine. May repeat in 2 hours. Max twice a day. 10 tablet 11   No facility-administered medications prior to visit.    PAST MEDICAL HISTORY: Past Medical History:  Diagnosis Date   Migraines     PAST SURGICAL HISTORY: Past Surgical History:  Procedure Laterality Date   BREAST ENHANCEMENT SURGERY     GALLBLADDER SURGERY     tummy tuck      FAMILY HISTORY: Family History  Problem Relation Age of Onset   Diabetes Mother    COPD Mother    Hypertension Father    Cancer Father        skin, leukemia    Heart disease Father    Diabetes Father    Cataracts Father    Diabetes Maternal Grandmother    Migraines Neg Hx     SOCIAL HISTORY: Social History   Socioeconomic History   Marital status: Unknown    Spouse name: Not on file   Number of children: Not on file   Years of education: Not on file   Highest education level: Not on file  Occupational History   Not on file  Tobacco Use   Smoking status: Never   Smokeless tobacco: Never  Vaping Use   Vaping status: Never Used  Substance and Sexual Activity   Alcohol use: Yes    Alcohol/week: 1.0 standard drink of alcohol    Types: 1 Glasses of wine per week   Drug  use: Never   Sexual activity: Not Currently  Other Topics Concern   Not on file  Social History Narrative   Caffeine- decaff one cup daily.  Education HS,.  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   Right handed   Lives alone   Social Drivers of Health   Financial Resource Strain: Not on file  Food Insecurity: No Food Insecurity (12/22/2020)   Received from [facility]   Hunger Vital Sign    Within the past 12 months, you worried that your food would run out before you got the money to buy more.: Never true    Within the past 12 months, the food you bought just didn't last and you didn't have money to get more.: Never true  Transportation Needs: Not on file  Physical Activity: Not on file  Stress: Not on file  Social Connections: Unknown (01/15/2022)   [facility] from [facility]   Social Network    Social Network: Not on file  Intimate Partner Violence: Unknown (12/07/2021)   Received from [facility]   HITS    Physically Hurt: Not on file    Insult or Talk Down To: Not on file    Threaten Physical Harm: Not on file    Scream or Curse: Not on file   PHYSICAL EXAM  Vitals:   03/18/24 1424  BP: 120/68  Resp: 15  Weight: 127 lb 8 oz (57.8 kg)  Height: 5' 2 (1.575 m)    Body mass index is 23.32 kg/m.  Generalized: Well developed, in no  acute distress  Neurological examination  Mentation: Alert oriented to time, place, history taking. Follows all commands speech and language fluent Cranial nerve II-XII: Pupils were equal round reactive to light. Extraocular movements were full, visual field were full on confrontational test. Facial sensation and strength were normal.  Head turning and shoulder shrug  were normal and symmetric. Motor: The motor testing reveals 5 over 5 strength of all 4 extremities. Good symmetric motor tone is noted throughout.  Sensory: Sensory testing is intact to soft touch on all 4 extremities. No evidence of extinction is noted.  Coordination: Cerebellar testing reveals good finger-nose-finger and heel-to-shin bilaterally.  Gait and station: Gait is normal. Tandem gait is normal.  Reflexes: Deep tendon reflexes are symmetric and normal bilaterally.   DIAGNOSTIC DATA (LABS, IMAGING, TESTING) - I reviewed patient records, labs, notes, testing and imaging myself where available.  Lab Results  Component Value Date   WBC 5.0 08/14/2022   HGB 13.5 08/14/2022   HCT 40.9 08/14/2022   MCV 86.1 08/14/2022   PLT 180.0 08/14/2022      Component Value Date/Time   NA 141 08/14/2022 0956   K 4.1 08/14/2022 0956   CL 105 08/14/2022 0956   CO2 29 08/14/2022 0956   GLUCOSE 95 08/14/2022 0956   BUN 15 08/14/2022 0956   CREATININE 0.66 08/14/2022 0956   CALCIUM 9.9 08/14/2022 0956   PROT 7.1 08/14/2022 0956   ALBUMIN 4.4 08/14/2022 0956   AST 21 08/14/2022 0956   ALT 18 08/14/2022 0956   ALKPHOS 48 08/14/2022 0956   BILITOT 0.4 08/14/2022 0956   Lab Results  Component Value Date   CHOL 193 08/14/2022   HDL 72.40 08/14/2022   LDLCALC 109 (H) 08/14/2022   TRIG 55.0 08/14/2022   CHOLHDL 3 08/14/2022   No results found for: HGBA1C Lab Results  Component Value Date   VITAMINB12 798 10/10/2022   Lab Results  Component Value Date  TSH 0.81 08/14/2022    Lauraine Born, AGNP-C, DNP 03/18/2024, 2:30  PM [facility] [address] [place], [place] [address] [phone]

## 2024-03-24 ENCOUNTER — Telehealth: Payer: Self-pay | Admitting: Neurology

## 2024-03-24 NOTE — Telephone Encounter (Signed)
 I have her scheduled for all three scans at [facility] and told her I would let her know when the MRA head got approved because it wasn't approved yet when we scheduled the appointment.

## 2024-03-27 ENCOUNTER — Other Ambulatory Visit (HOSPITAL_COMMUNITY): Payer: Self-pay

## 2024-04-01 ENCOUNTER — Ambulatory Visit

## 2024-04-01 DIAGNOSIS — M5030 Other cervical disc degeneration, unspecified cervical region: Secondary | ICD-10-CM

## 2024-04-01 DIAGNOSIS — M4802 Spinal stenosis, cervical region: Secondary | ICD-10-CM

## 2024-04-01 DIAGNOSIS — G43009 Migraine without aura, not intractable, without status migrainosus: Secondary | ICD-10-CM

## 2024-04-02 ENCOUNTER — Ambulatory Visit (INDEPENDENT_AMBULATORY_CARE_PROVIDER_SITE_OTHER)

## 2024-04-02 DIAGNOSIS — M5030 Other cervical disc degeneration, unspecified cervical region: Secondary | ICD-10-CM

## 2024-04-02 DIAGNOSIS — G43009 Migraine without aura, not intractable, without status migrainosus: Secondary | ICD-10-CM | POA: Diagnosis not present

## 2024-04-02 DIAGNOSIS — M4802 Spinal stenosis, cervical region: Secondary | ICD-10-CM | POA: Diagnosis not present

## 2024-04-07 ENCOUNTER — Telehealth: Payer: Self-pay | Admitting: Neurology

## 2024-04-07 NOTE — Telephone Encounter (Signed)
 See my chart note.

## 2024-04-15 ENCOUNTER — Ambulatory Visit: Payer: Self-pay | Admitting: Neurology

## 2024-04-15 NOTE — Progress Notes (Signed)
 Please call and inform patient that her angiogram did not show any narrowing of the blood vessel and it is stable when compared to the one on 2022.

## 2024-04-15 NOTE — Progress Notes (Signed)
 Please call and inform patient that MRI brain did not show any acute findings.

## 2024-04-16 NOTE — Progress Notes (Signed)
 Please call and inform patient that her cervical spine MRI shows area of narrowing causing compression of the nerve root. Lauraine will contact her to discuss next step.

## 2024-04-17 ENCOUNTER — Encounter: Payer: Self-pay | Admitting: Neurology

## 2024-08-19 ENCOUNTER — Other Ambulatory Visit (HOSPITAL_COMMUNITY): Payer: Self-pay

## 2024-08-19 ENCOUNTER — Telehealth: Payer: Self-pay

## 2024-08-19 NOTE — Telephone Encounter (Signed)
 Pharmacy Patient Advocate Encounter   Received notification from Fax that prior authorization for Ubrelvy  is required/requested.   Insurance verification completed.   The patient is insured through [facility].   Per test claim: PA required; PA started via CoverMyMeds. KEY [id number] . Waiting for clinical questions to populate.

## 2024-08-21 ENCOUNTER — Other Ambulatory Visit (HOSPITAL_COMMUNITY): Payer: Self-pay

## 2024-08-21 NOTE — Telephone Encounter (Signed)
 Pharmacy Patient Advocate Encounter  Received notification from [facility] that Prior Authorization for Ubrelvy  has been APPROVED from 08/21/2024 to 08/21/2025. Ran test claim, Copay is $0. This test claim was processed through [facility]- copay amounts may vary at other pharmacies due to pharmacy/plan contracts, or as the patient moves through the different stages of their insurance plan.   PA #/Case ID/Reference #: [id number]

## 2024-08-21 NOTE — Telephone Encounter (Signed)
 Clinical questions have been answered and PA submitted. PA currently Pending. Please be advised that most companies allow up to 30 days to make a decision. We will advise when a determination has been made, or follow up in 1 week.   Please reach out to our team, Rx Prior Auth Pool, if you haven't heard back in a week.

## 2024-09-09 ENCOUNTER — Encounter: Payer: Self-pay | Admitting: Neurology
